# Patient Record
Sex: Male | Born: 1960 | Race: White | Hispanic: No | Marital: Married | Smoking: Current every day smoker
Health system: Southern US, Community
[De-identification: ages and names within clinical notes are randomized; demographics above are authoritative.]

## PROBLEM LIST (undated history)

## (undated) DIAGNOSIS — B029 Zoster without complications: Secondary | ICD-10-CM

## (undated) DIAGNOSIS — I10 Essential (primary) hypertension: Secondary | ICD-10-CM

## (undated) DIAGNOSIS — B019 Varicella without complication: Secondary | ICD-10-CM

## (undated) DIAGNOSIS — L0390 Cellulitis, unspecified: Secondary | ICD-10-CM

## (undated) HISTORY — PX: UVULOPALATOPHARYNGOPLASTY: SHX827

---

## 2012-12-26 ENCOUNTER — Other Ambulatory Visit: Payer: Self-pay | Admitting: Orthopedic Surgery

## 2012-12-26 DIAGNOSIS — M25511 Pain in right shoulder: Secondary | ICD-10-CM

## 2013-01-11 ENCOUNTER — Ambulatory Visit
Admission: RE | Admit: 2013-01-11 | Discharge: 2013-01-11 | Disposition: A | Discharge status: Home / Self Care | Payer: BC Managed Care – PPO | Source: Ambulatory Visit | Attending: Orthopedic Surgery | Admitting: Orthopedic Surgery

## 2013-01-11 DIAGNOSIS — M25511 Pain in right shoulder: Secondary | ICD-10-CM

## 2013-01-11 MED ORDER — IOHEXOL 180 MG/ML  SOLN: 15.0000 mL | Freq: Once | INTRAMUSCULAR | Status: AC | PRN

## 2013-01-15 ENCOUNTER — Other Ambulatory Visit: Payer: Self-pay | Admitting: Orthopedic Surgery

## 2013-01-15 DIAGNOSIS — M25512 Pain in left shoulder: Secondary | ICD-10-CM

## 2013-01-20 ENCOUNTER — Ambulatory Visit
Admission: RE | Admit: 2013-01-20 | Discharge: 2013-01-20 | Disposition: A | Discharge status: Home / Self Care | Payer: BC Managed Care – PPO | Source: Ambulatory Visit | Attending: Orthopedic Surgery | Admitting: Orthopedic Surgery

## 2013-01-20 DIAGNOSIS — M25512 Pain in left shoulder: Secondary | ICD-10-CM

## 2013-08-08 ENCOUNTER — Emergency Department (HOSPITAL_COMMUNITY)
Admission: EM | Admit: 2013-08-08 | Discharge: 2013-08-08 | Disposition: A | Discharge status: Home / Self Care | Payer: BC Managed Care – PPO | Attending: Emergency Medicine | Admitting: Emergency Medicine

## 2013-08-08 ENCOUNTER — Encounter (HOSPITAL_COMMUNITY): Payer: Self-pay | Admitting: Emergency Medicine

## 2013-08-08 ENCOUNTER — Emergency Department (HOSPITAL_COMMUNITY): Payer: BC Managed Care – PPO

## 2013-08-08 DIAGNOSIS — Y9389 Activity, other specified: Secondary | ICD-10-CM | POA: Insufficient documentation

## 2013-08-08 DIAGNOSIS — Z880 Allergy status to penicillin: Secondary | ICD-10-CM | POA: Insufficient documentation

## 2013-08-08 DIAGNOSIS — Y929 Unspecified place or not applicable: Secondary | ICD-10-CM | POA: Insufficient documentation

## 2013-08-08 DIAGNOSIS — S299XXA Unspecified injury of thorax, initial encounter: Secondary | ICD-10-CM

## 2013-08-08 DIAGNOSIS — I10 Essential (primary) hypertension: Secondary | ICD-10-CM | POA: Insufficient documentation

## 2013-08-08 DIAGNOSIS — F17200 Nicotine dependence, unspecified, uncomplicated: Secondary | ICD-10-CM | POA: Insufficient documentation

## 2013-08-08 DIAGNOSIS — S298XXA Other specified injuries of thorax, initial encounter: Secondary | ICD-10-CM | POA: Insufficient documentation

## 2013-08-08 DIAGNOSIS — W208XXA Other cause of strike by thrown, projected or falling object, initial encounter: Secondary | ICD-10-CM | POA: Insufficient documentation

## 2013-08-08 HISTORY — DX: Essential (primary) hypertension: I10

## 2013-08-08 MED ORDER — HYDROCODONE-ACETAMINOPHEN 5-325 MG PO TABS: 1.0000 | ORAL_TABLET | ORAL | Status: DC | PRN

## 2013-08-08 NOTE — Discharge Instructions (Signed)
Blunt Chest Trauma Call Dr. Duanne Guessewey to schedule an office visit if having significant pain by next week. Take Tylenol for mild pain or the pain medicine prescribed for bad pain Blunt chest trauma is an injury caused by a blow to the chest. These chest injuries can be very painful. Blunt chest trauma often results in bruised or broken (fractured) ribs. Most cases of bruised and fractured ribs from blunt chest traumas get better after 1 to 3 weeks of rest and pain medicine. Often, the soft tissue in the chest wall is also injured, causing pain and bruising. Internal organs, such as the heart and lungs, may also be injured. Blunt chest trauma can lead to serious medical problems. This injury requires immediate medical care. CAUSES   Motor vehicle collisions.  Falls.  Physical violence.  Sports injuries. SYMPTOMS   Chest pain. The pain may be worse when you move or breathe deeply.  Shortness of breath.  Lightheadedness.  Bruising.  Tenderness.  Swelling. DIAGNOSIS  Your caregiver will do a physical exam. X-rays may be taken to look for fractures. However, minor rib fractures may not show up on X-rays until a few days after the injury. If a more serious injury is suspected, further imaging tests may be done. This may include ultrasounds, computed tomography (CT) scans, or magnetic resonance imaging (MRI). TREATMENT  Treatment depends on the severity of your injury. Your caregiver may prescribe pain medicines and deep breathing exercises. HOME CARE INSTRUCTIONS  Limit your activities until you can move around without much pain.  Do not do any strenuous work until your injury is healed.  Put ice on the injured area.  Put ice in a plastic bag.  Place a towel between your skin and the bag.  Leave the ice on for 15-20 minutes, 03-04 times a day.  You may wear a rib belt as directed by your caregiver to reduce pain.  Practice deep breathing as directed by your caregiver to keep your  lungs clear.  Only take over-the-counter or prescription medicines for pain, fever, or discomfort as directed by your caregiver. SEEK IMMEDIATE MEDICAL CARE IF:   You have increasing pain or shortness of breath.  You cough up blood.  You have nausea, vomiting, or abdominal pain.  You have a fever.  You feel dizzy, weak, or you faint. MAKE SURE YOU:  Understand these instructions.  Will watch your condition.  Will get help right away if you are not doing well or get worse. Document Released: 01/29/2004 Document Revised: 03/15/2011 Document Reviewed: 10/07/2010 [facility] Patient Information 2015 [place], [place]. This information is not intended to replace advice given to you by your health care provider. Make sure you discuss any questions you have with your health care provider.

## 2013-08-08 NOTE — ED Notes (Signed)
Pt was bench pressing about 300 pounds 20 minutes ago when he dropped the bar from fully extended arms down onto his chest.  C/o central CP 6/10.  States he passed out "for a second" but was able to slide the bar off of himself.

## 2013-08-08 NOTE — ED Provider Notes (Signed)
CSN: [id number]     Arrival date & time 08/08/13  0932 History   First MD Initiated Contact with Patient 08/08/13 [phone]     Chief Complaint  Patient presents with  . Chest Injury     (Consider location/radiation/quality/duration/timing/severity/associated sxs/prior Treatment) HPI Complains of anterior bilateral chest pain onset 40 minutes ago after he was bench pressing 300 pounds and bar from bar bell dropped onto his anterior chest. Pain is worse with deep inspiration or with movement improved with remaining still. No other associated symptoms. No shortness of breath. No other injury. No treatment prior to coming here. Pain is moderate at present Past Medical History  Diagnosis Date  . Hypertension    Past Surgical History  Procedure Laterality Date  . Uvulopalatopharyngoplasty     History reviewed. No pertinent family history. History  Substance Use Topics  . Smoking status: Current Every Day Smoker  . Smokeless tobacco: Not on file  . Alcohol Use: No    no illicit drug use Review of Systems  Constitutional: Negative.   HENT: Negative.   Respiratory: Negative.   Cardiovascular: Positive for chest pain.  Gastrointestinal: Negative.   Musculoskeletal: Negative.   Skin: Negative.   Neurological: Negative.   Psychiatric/Behavioral: Negative.   All other systems reviewed and are negative.     Allergies  Amoxicillin  Home Medications   Prior to Admission medications   Not on File   BP 171/87  Pulse 86  Temp(Src) 98.6 F (37 C) (Oral)  Resp 16  SpO2 100% Physical Exam  Nursing note and vitals reviewed. Constitutional: He appears well-developed and well-nourished. No distress.  HENT:  Head: Normocephalic and atraumatic.  Eyes: Conjunctivae are normal. Pupils are equal, round, and reactive to light.  Neck: Neck supple. No tracheal deviation present. No thyromegaly present.  Cardiovascular: Normal rate and regular rhythm.   No murmur heard. Pulmonary/Chest:  Effort normal and breath sounds normal. No respiratory distress. He exhibits tenderness.  No ecchymosis . No deformity Tender over anterior chest wall, bilaterally over lower ribs and sternum, no crepitance no flail.  Abdominal: Soft. Bowel sounds are normal. He exhibits no distension. There is no tenderness.  Musculoskeletal: Normal range of motion. He exhibits no edema and no tenderness.  Neurological: He is alert. Coordination normal.  Skin: Skin is warm and dry. No rash noted.  Psychiatric: He has a normal mood and affect.    ED Course  Procedures (including critical care time) Labs Review Labs Reviewed - No data to display  Imaging Review No results found.   EKG Interpretation   Date/Time:  Wednesday August 08 2013 07:57:27 EDT Ventricular Rate:  93 PR Interval:  154 QRS Duration: 91 QT Interval:  326 QTC Calculation: 405 R Axis:   26 Text Interpretation:  Sinus rhythm Baseline wander in lead(s) V2 V3 V4 V5  No old tracing to compare Confirmed by Gared Gillie  MD, Hazell Siwik [phone]) on  08/08/2013 8:17:28 AM      No results found for this or any previous visit. Dg Chest 2 View  08/08/2013   CLINICAL Plan prescription NorcoDATA:  Chest injury involving anterior ribs.  EXAM: CHEST - 2 VIEW  COMPA diagnosisRISON:  None  FINDINGS: The heart size and mediastinal contours are within normal limits. There is no evidence of pulmonary edema, consolidation, pneumothorax, nodule or pleural fluid. The visualized skeletal structures are unremarkable.  IMPRESSION: Normal chest x-ray.   Electronically Signed   By: Irish Lack M.D.   On: 08/08/2013 08:29  Declines pain medicine Chest x-ray viewed by me MDM  plan rx norco Follow up with Dr,. Dewey if significant pain by next week Dx Blunt chest trauma Final diagnoses:  None        Doug SouSam Carmalita Wakefield, MD 08/08/13 [phone]

## 2013-08-20 ENCOUNTER — Ambulatory Visit
Admission: RE | Admit: 2013-08-20 | Discharge: 2013-08-20 | Disposition: A | Discharge status: Home / Self Care | Payer: BC Managed Care – PPO | Source: Ambulatory Visit | Attending: Family Medicine | Admitting: Family Medicine

## 2013-08-20 ENCOUNTER — Other Ambulatory Visit: Payer: Self-pay | Admitting: Family Medicine

## 2013-08-20 DIAGNOSIS — R52 Pain, unspecified: Secondary | ICD-10-CM

## 2013-08-20 DIAGNOSIS — R0602 Shortness of breath: Secondary | ICD-10-CM

## 2013-08-20 DIAGNOSIS — T1490XA Injury, unspecified, initial encounter: Secondary | ICD-10-CM

## 2014-06-19 ENCOUNTER — Encounter (HOSPITAL_COMMUNITY): Payer: Self-pay

## 2014-06-19 ENCOUNTER — Emergency Department (HOSPITAL_COMMUNITY)
Admission: EM | Admit: 2014-06-19 | Discharge: 2014-06-20 | Disposition: A | Discharge status: Home / Self Care | Payer: BLUE CROSS/BLUE SHIELD | Attending: Emergency Medicine | Admitting: Emergency Medicine

## 2014-06-19 DIAGNOSIS — Z6833 Body mass index (BMI) 33.0-33.9, adult: Secondary | ICD-10-CM | POA: Insufficient documentation

## 2014-06-19 DIAGNOSIS — I10 Essential (primary) hypertension: Secondary | ICD-10-CM | POA: Insufficient documentation

## 2014-06-19 DIAGNOSIS — Z79899 Other long term (current) drug therapy: Secondary | ICD-10-CM | POA: Diagnosis not present

## 2014-06-19 DIAGNOSIS — G4730 Sleep apnea, unspecified: Secondary | ICD-10-CM | POA: Diagnosis not present

## 2014-06-19 DIAGNOSIS — T185XXA Foreign body in anus and rectum, initial encounter: Secondary | ICD-10-CM | POA: Insufficient documentation

## 2014-06-19 DIAGNOSIS — X58XXXA Exposure to other specified factors, initial encounter: Secondary | ICD-10-CM | POA: Diagnosis not present

## 2014-06-19 DIAGNOSIS — F17200 Nicotine dependence, unspecified, uncomplicated: Secondary | ICD-10-CM | POA: Diagnosis not present

## 2014-06-19 DIAGNOSIS — Z79891 Long term (current) use of opiate analgesic: Secondary | ICD-10-CM | POA: Insufficient documentation

## 2014-06-19 DIAGNOSIS — J449 Chronic obstructive pulmonary disease, unspecified: Secondary | ICD-10-CM | POA: Diagnosis not present

## 2014-06-19 NOTE — ED Notes (Signed)
Patient reports a silicone sex toy is stuck in his rectum.  Denies moving parts/batteries.  Reports he has been trying to remove it for the past several hours.

## 2014-06-20 ENCOUNTER — Emergency Department (HOSPITAL_COMMUNITY): Payer: BLUE CROSS/BLUE SHIELD | Admitting: Anesthesiology

## 2014-06-20 ENCOUNTER — Encounter (HOSPITAL_COMMUNITY)
Admission: EM | Disposition: A | Discharge status: Home / Self Care | Payer: Self-pay | Source: Home / Self Care | Attending: Emergency Medicine

## 2014-06-20 ENCOUNTER — Emergency Department (HOSPITAL_COMMUNITY): Payer: BLUE CROSS/BLUE SHIELD

## 2014-06-20 ENCOUNTER — Encounter (HOSPITAL_COMMUNITY): Payer: Self-pay | Admitting: Anesthesiology

## 2014-06-20 HISTORY — PX: FOREIGN BODY REMOVAL RECTAL: SHX5275

## 2014-06-20 LAB — BASIC METABOLIC PANEL
Anion gap: 8 (ref 5–15)
BUN: 29 mg/dL — AB (ref 6–20)
CO2: 24 mmol/L (ref 22–32)
Calcium: 9 mg/dL (ref 8.9–10.3)
Chloride: 109 mmol/L (ref 101–111)
Creatinine, Ser: 1.28 mg/dL — ABNORMAL HIGH (ref 0.61–1.24)
GFR calc Af Amer: >60 mL/min (ref >60)
Glucose, Bld: 97 mg/dL (ref 65–99)
Potassium: 4.2 mmol/L (ref 3.5–5.1)
Sodium: 141 mmol/L (ref 135–145)

## 2014-06-20 LAB — CBC WITH DIFFERENTIAL/PLATELET
Basophils Absolute: 0.1 10*3/uL (ref 0.0–0.1)
Basophils Relative: 1 % (ref 0–1)
EOS ABS: 0.2 10*3/uL (ref 0.0–0.7)
EOS PCT: 2 % (ref 0–5)
HCT: 45.6 % (ref 39.0–52.0)
Hemoglobin: 15.7 g/dL (ref 13.0–17.0)
Lymphocytes Relative: 19 % (ref 12–46)
Lymphs Abs: 1.9 10*3/uL (ref 0.7–4.0)
MCH: 32.7 pg (ref 26.0–34.0)
MCHC: 34.4 g/dL (ref 30.0–36.0)
MCV: 95 fL (ref 78.0–100.0)
Monocytes Absolute: 0.7 10*3/uL (ref 0.1–1.0)
Monocytes Relative: 8 % (ref 3–12)
Neutro Abs: 6.8 10*3/uL (ref 1.7–7.7)
Neutrophils Relative %: 70 % (ref 43–77)
PLATELETS: 250 10*3/uL (ref 150–400)
RBC: 4.8 MIL/uL (ref 4.22–5.81)
RDW: 13.2 % (ref 11.5–15.5)
WBC: 9.6 10*3/uL (ref 4.0–10.5)

## 2014-06-20 SURGERY — REMOVAL, FOREIGN BODY, RECTUM
Anesthesia: General

## 2014-06-20 MED ORDER — MIDAZOLAM HCL 2 MG/2ML IJ SOLN
0.5000 mg | Freq: Once | INTRAMUSCULAR | Status: DC | PRN
Start: 2014-06-20 — End: 2014-06-20

## 2014-06-20 MED ORDER — EPHEDRINE SULFATE 50 MG/ML IJ SOLN
INTRAMUSCULAR | Status: AC
  Filled 2014-06-20: qty 1

## 2014-06-20 MED ORDER — FENTANYL CITRATE (PF) 100 MCG/2ML IJ SOLN
INTRAMUSCULAR | Status: DC | PRN
  Administered 2014-06-20: 50 ug via INTRAVENOUS

## 2014-06-20 MED ORDER — MIDAZOLAM HCL 2 MG/2ML IJ SOLN
INTRAMUSCULAR | Status: AC
  Filled 2014-06-20: qty 2

## 2014-06-20 MED ORDER — ONDANSETRON HCL 4 MG/2ML IJ SOLN
INTRAMUSCULAR | Status: AC
  Filled 2014-06-20: qty 2

## 2014-06-20 MED ORDER — LIDOCAINE HCL (CARDIAC) 20 MG/ML IV SOLN
INTRAVENOUS | Status: AC
  Filled 2014-06-20: qty 5

## 2014-06-20 MED ORDER — FENTANYL CITRATE (PF) 100 MCG/2ML IJ SOLN: 25.0000 ug | INTRAMUSCULAR | Status: DC | PRN

## 2014-06-20 MED ORDER — MIDAZOLAM HCL 5 MG/5ML IJ SOLN
INTRAMUSCULAR | Status: DC | PRN
  Administered 2014-06-20: 2 mg via INTRAVENOUS

## 2014-06-20 MED ORDER — PROMETHAZINE HCL 25 MG/ML IJ SOLN: 6.2500 mg | INTRAMUSCULAR | Status: DC | PRN

## 2014-06-20 MED ORDER — LACTATED RINGERS IV SOLN
INTRAVENOUS | Status: DC | PRN
  Administered 2014-06-20: 07:00:00 via INTRAVENOUS

## 2014-06-20 MED ORDER — ONDANSETRON HCL 4 MG/2ML IJ SOLN
INTRAMUSCULAR | Status: DC | PRN
  Administered 2014-06-20: 4 mg via INTRAVENOUS

## 2014-06-20 MED ORDER — MEPERIDINE HCL 50 MG/ML IJ SOLN: 6.2500 mg | INTRAMUSCULAR | Status: DC | PRN

## 2014-06-20 MED ORDER — FENTANYL CITRATE (PF) 100 MCG/2ML IJ SOLN
INTRAMUSCULAR | Status: AC
  Filled 2014-06-20: qty 2

## 2014-06-20 MED ORDER — PROPOFOL 10 MG/ML IV BOLUS
INTRAVENOUS | Status: DC | PRN
  Administered 2014-06-20: 200 mg via INTRAVENOUS

## 2014-06-20 MED ORDER — SODIUM CHLORIDE 0.9 % IJ SOLN
INTRAMUSCULAR | Status: AC
  Filled 2014-06-20: qty 10

## 2014-06-20 MED ORDER — LIDOCAINE HCL (CARDIAC) 20 MG/ML IV SOLN
INTRAVENOUS | Status: DC | PRN
  Administered 2014-06-20: 100 mg via INTRAVENOUS

## 2014-06-20 MED ORDER — PROPOFOL 10 MG/ML IV BOLUS
INTRAVENOUS | Status: AC
  Filled 2014-06-20: qty 20

## 2014-06-20 SURGICAL SUPPLY — 34 items
BLADE HEX COATED 2.75 (ELECTRODE) ×3 IMPLANT
BLADE SURG 15 STRL LF DISP TIS (BLADE) ×1 IMPLANT
BLADE SURG 15 STRL SS (BLADE) ×2
BLADE SURG SZ10 CARB STEEL (BLADE) ×3 IMPLANT
COVER MAYO STAND STRL (DRAPES) ×3 IMPLANT
DECANTER SPIKE VIAL GLASS SM (MISCELLANEOUS) ×3 IMPLANT
DRAPE SHEET LG 3/4 BI-LAMINATE (DRAPES) ×3 IMPLANT
DRSG PAD ABDOMINAL 8X10 ST (GAUZE/BANDAGES/DRESSINGS) ×3 IMPLANT
ELECT REM PT RETURN 9FT ADLT (ELECTROSURGICAL) ×3
ELECTRODE REM PT RTRN 9FT ADLT (ELECTROSURGICAL) ×1 IMPLANT
GAUZE SPONGE 4X4 12PLY STRL (GAUZE/BANDAGES/DRESSINGS) ×3 IMPLANT
GAUZE SPONGE 4X4 16PLY XRAY LF (GAUZE/BANDAGES/DRESSINGS) ×3 IMPLANT
GLOVE BIOGEL PI IND STRL 7.0 (GLOVE) ×1 IMPLANT
GLOVE BIOGEL PI INDICATOR 7.0 (GLOVE) ×2
GLOVE SURG SIGNA 7.5 PF LTX (GLOVE) ×3 IMPLANT
GOWN SPEC L4 XLG W/TWL (GOWN DISPOSABLE) ×3 IMPLANT
GOWN STRL REUS W/ TWL XL LVL3 (GOWN DISPOSABLE) ×3 IMPLANT
GOWN STRL REUS W/TWL LRG LVL3 (GOWN DISPOSABLE) ×3 IMPLANT
GOWN STRL REUS W/TWL XL LVL3 (GOWN DISPOSABLE) ×6
KIT BASIN OR (CUSTOM PROCEDURE TRAY) ×3 IMPLANT
NDL SAFETY ECLIPSE 18X1.5 (NEEDLE) IMPLANT
NEEDLE HYPO 18GX1.5 SHARP (NEEDLE)
NEEDLE HYPO 25X1 1.5 SAFETY (NEEDLE) IMPLANT
NS IRRIG 1000ML POUR BTL (IV SOLUTION) ×3 IMPLANT
PACK LITHOTOMY IV (CUSTOM PROCEDURE TRAY) ×3 IMPLANT
PENCIL BUTTON HOLSTER BLD 10FT (ELECTRODE) ×3 IMPLANT
SPONGE SURGIFOAM ABS GEL 12-7 (HEMOSTASIS) IMPLANT
SURGILUBE 3G PEEL PACK STRL (MISCELLANEOUS) ×18 IMPLANT
SUT CHROMIC 2 0 SH (SUTURE) IMPLANT
SUT CHROMIC 3 0 SH 27 (SUTURE) IMPLANT
SYR CONTROL 10ML LL (SYRINGE) ×3 IMPLANT
TOWEL OR 17X26 10 PK STRL BLUE (TOWEL DISPOSABLE) ×3 IMPLANT
WATER STERILE IRR 1500ML POUR (IV SOLUTION) ×3 IMPLANT
YANKAUER SUCT BULB TIP 10FT TU (MISCELLANEOUS) ×3 IMPLANT

## 2014-06-20 NOTE — Anesthesia Preprocedure Evaluation (Addendum)
Anesthesia Evaluation  Patient identified by MRN, date of birth, ID band Patient awake    Reviewed: Allergy & Precautions, NPO status , Patient's Chart, lab work & pertinent test results  History of Anesthesia Complications (+) history of anesthetic complications (hypertension)  Airway Mallampati: II  TM Distance: >3 FB Neck ROM: Full    Dental  (+) Dental Advisory Given   Pulmonary sleep apnea (treated with UPPP) , COPDCurrent Smoker,  S/p UPPP breath sounds clear to auscultation        Cardiovascular hypertension (no longer on meds), Rhythm:Regular Rate:Normal     Neuro/Psych negative neurological ROS     GI/Hepatic negative GI ROS, Neg liver ROS,   Endo/Other  Morbid obesity  Renal/GU negative Renal ROS     Musculoskeletal   Abdominal (+) + obese,   Peds  Hematology negative hematology ROS (+)   Anesthesia Other Findings   Reproductive/Obstetrics                            Anesthesia Physical Anesthesia Plan  ASA: III  Anesthesia Plan: General   Post-op Pain Management:    Induction: Intravenous  Airway Management Planned: LMA  Additional Equipment:   Intra-op Plan:   Post-operative Plan:   Informed Consent: I have reviewed the patients History and Physical, chart, labs and discussed the procedure including the risks, benefits and alternatives for the proposed anesthesia with the patient or authorized representative who has indicated his/her understanding and acceptance.   Dental advisory given  Plan Discussed with: CRNA and Surgeon  Anesthesia Plan Comments: (Plan routine monitors, GA- LMA OK)        Anesthesia Quick Evaluation

## 2014-06-20 NOTE — Transfer of Care (Signed)
Immediate Anesthesia Transfer of Care Note  Patient: Gary Davies  Procedure(s) Performed: Procedure(s): REMOVAL FOREIGN BODY RECTAL (N/A)  Patient Location: PACU  Anesthesia Type:General  Level of Consciousness: awake, alert  and oriented  Airway & Oxygen Therapy: Patient Spontanous Breathing and Patient connected to face mask oxygen  Post-op Assessment: Report given to RN and Post -op Vital signs reviewed and stable  Post vital signs: Reviewed and stable  Last Vitals:  Filed Vitals:   06/20/14 0537  BP: 147/93  Pulse: 88  Temp: 37.1 C  Resp: 16    Complications: No apparent anesthesia complications

## 2014-06-20 NOTE — ED Notes (Signed)
Surgery at bedside.

## 2014-06-20 NOTE — Op Note (Addendum)
06/19/2014 - 06/20/2014  8:23 AM  PATIENT:  Gary Davies, 54 y.o., male, MRN: [id number]  PREOP DIAGNOSIS:  Foreign body in rectum  POSTOP DIAGNOSIS:   Foreign body in rectum (photo at end of note)  PROCEDURE:   Procedure(s):  REMOVAL FOREIGN BODY RECTAL, Exam under anesthesia.  SURGEON:   Ovidio Kin, M.D.  ANESTHESIA:   general  Anesthesiologist: Jairo Ben, MD CRNA: Thornell Mule, CRNA  General  EBL:  none  SPECIMEN:   Foreign body removed.  Photo in Epic  COUNTS CORRECT:  YES  INDICATIONS FOR PROCEDURE:  [name] is a 54 y.o. (DOB: 08/12/1960) white  male whose primary care physician is No primary care provider on file. and comes for foreign body in rectum.   The patient requests that his wife not be told about the foreign body.   The indications and risks of the surgery were explained to the patient.  The risks include, but are not limited to, infection, bleeding, and nerve injury.  PROCEDURE:  The patient was placed in lithotomy.   A time out was held and the surgical checklist run.   I was able to palpate the foreign body through the abdominal wall.  I could feel the foreign body about 6 cm up the rectum.  I was able to push the foreign body down and retrieve it through the rectum.   There was no blood on the foreign body or in the rectum.   He will go home today with return to our office on a PRN basis.   Foreign body retrieved from rectum.    Ovidio Kin, MD, [facility] Surgery Pager: [phone] Office phone:  [phone]

## 2014-06-20 NOTE — ED Notes (Signed)
Patient transported to CT 

## 2014-06-20 NOTE — Addendum Note (Signed)
Addendum  created 06/20/14 0944 by Thornell Mule, CRNA   Modules edited: Anesthesia Attestations

## 2014-06-20 NOTE — ED Notes (Signed)
Pt reports getting restless, feeling "feverish", asking if he can go outside "on fresh air, because I'm going crazy here". Pt sts if we can't tell him when exactly is somebody come to see him, maybe he should just go elsewhere. Explained to pt that at this point he is waiting for surgeon to see him, and that if he goes to other area ED he will have to go through all process all over again. Pt voiced understanding.

## 2014-06-20 NOTE — Progress Notes (Signed)
No rectal drainage noted post op

## 2014-06-20 NOTE — Progress Notes (Signed)
54 yo male with foreign body up rectum.  Plan EUA with removal of foreign body.  No acute abdominal symptoms.  Wife at bedside.  He has not told her what he has done and he request that we do not tell her.  He understands that there is a chance of a laparotomy.  Ovidio Kin, MD, [facility] Surgery Pager: [phone] Office phone:  [phone]

## 2014-06-20 NOTE — ED Provider Notes (Signed)
CSN: [id number]     Arrival date & time 06/19/14  2334 History   First MD Initiated Contact with Patient 06/20/14 0205     Chief Complaint  Patient presents with  . Foreign Body in Rectum     (Consider location/radiation/quality/duration/timing/severity/associated sxs/prior Treatment) Patient is a 54 y.o. male presenting with foreign body in rectum. The history is provided by the patient. No language interpreter was used.  Foreign Body in Rectum This is a new problem. The current episode started today. Pertinent negatives include no chills or fever. Associated symptoms comments: He placed a non-powered sexual toy (silicone) into the rectum yesterday and has been unable to remove it despite many hours of attempt. No nausea or vomiting. No blood per rectum. He denies significant pain..    Past Medical History  Diagnosis Date  . Hypertension    Past Surgical History  Procedure Laterality Date  . Uvulopalatopharyngoplasty     No family history on file. History  Substance Use Topics  . Smoking status: Current Every Day Smoker  . Smokeless tobacco: Not on file  . Alcohol Use: No    Review of Systems  Constitutional: Negative for fever and chills.  HENT: Negative.   Respiratory: Negative.   Cardiovascular: Negative.   Gastrointestinal: Negative.        See HPI.  Musculoskeletal: Negative.   Skin: Negative.   Neurological: Negative.       Allergies  Amoxicillin  Home Medications   Prior to Admission medications   Medication Sig Start Date End Date Taking? Authorizing Provider  HYDROcodone-acetaminophen (NORCO) 5-325 MG per tablet Take 1-2 tablets by mouth every 4 (four) hours as needed for severe pain. 08/08/13   Doug Sou, MD  nebivolol (BYSTOLIC) 10 MG tablet Take 10 mg by mouth every morning.    Historical Provider, MD  Omega-3 Fatty Acids (FISH OIL) 1000 MG CAPS Take 4,000 mg by mouth every evening.    Historical Provider, MD  OVER THE COUNTER MEDICATION Take 1  packet by mouth at bedtime. Magnesium Powder    Historical Provider, MD  Testosterone (AXIRON) 30 MG/ACT SOLN Place 4 application onto the skin every morning. Applies to each armpit.    Historical Provider, MD  valsartan (DIOVAN) 320 MG tablet Take 320 mg by mouth every morning.    Historical Provider, MD   BP 158/97 mmHg  Pulse 102  Temp(Src) 98.9 F (37.2 C) (Oral)  Resp 18  Ht  (1.676 m)  Wt 220 lb (99.791 kg)  BMI 35.53 kg/m2  SpO2 100% Physical Exam  Constitutional: He is oriented to person, place, and time. He appears well-developed and well-nourished.  Neck: Normal range of motion.  Pulmonary/Chest: Effort normal.  Abdominal: Soft. He exhibits no distension. There is no tenderness.  Genitourinary:  No swelling, bleeding or palpable foreign body on rectal exam.   Musculoskeletal: Normal range of motion.  Neurological: He is alert and oriented to person, place, and time.  Skin: Skin is warm and dry.  Psychiatric: He has a normal mood and affect.    ED Course  Procedures (including critical care time) Labs Review Labs Reviewed  CBC WITH DIFFERENTIAL/PLATELET  BASIC METABOLIC PANEL   Results for orders placed or performed during the hospital encounter of 06/19/14  CBC with Differential  Result Value Ref Range   WBC 9.6 4.0 - 10.5 K/uL   RBC 4.80 4.22 - 5.81 MIL/uL   Hemoglobin 15.7 13.0 - 17.0 g/dL   HCT 40.9 81.1 - 91.4 %  MCV 95.0 78.0 - 100.0 fL   MCH 32.7 26.0 - 34.0 pg   MCHC 34.4 30.0 - 36.0 g/dL   RDW 48.1 85.6 - 31.4 %   Platelets 250 150 - 400 K/uL   Neutrophils Relative % 70 43 - 77 %   Neutro Abs 6.8 1.7 - 7.7 K/uL   Lymphocytes Relative 19 12 - 46 %   Lymphs Abs 1.9 0.7 - 4.0 K/uL   Monocytes Relative 8 3 - 12 %   Monocytes Absolute 0.7 0.1 - 1.0 K/uL   Eosinophils Relative 2 0 - 5 %   Eosinophils Absolute 0.2 0.0 - 0.7 K/uL   Basophils Relative 1 0 - 1 %   Basophils Absolute 0.1 0.0 - 0.1 K/uL  Basic metabolic panel  Result Value Ref Range    Sodium 141 135 - 145 mmol/L   Potassium 4.2 3.5 - 5.1 mmol/L   Chloride 109 101 - 111 mmol/L   CO2 24 22 - 32 mmol/L   Glucose, Bld 97 65 - 99 mg/dL   BUN 29 (H) 6 - 20 mg/dL   Creatinine, Ser 9.70 (H) 0.61 - 1.24 mg/dL   Calcium 9.0 8.9 - 26.3 mg/dL   GFR calc non Af Amer >60 >60 mL/min   GFR calc Af Amer >60 >60 mL/min   Anion gap 8 5 - 15    Imaging Review Dg Abd Acute W/chest  06/20/2014   CLINICAL DATA:  Silicone foreign body stuck in the rectum. Unable to remove over the past several hours.  EXAM: DG ABDOMEN ACUTE W/ 1V CHEST  COMPARISON:  Chest 08/20/2013  FINDINGS: Normal heart size and pulmonary vascularity. No focal airspace disease or consolidation in the lungs. No blunting of costophrenic angles. No pneumothorax. Mediastinal contours appear intact.  Gas and stool throughout the colon. No small or large bowel distention. No free intra-abdominal air. No abnormal air-fluid levels. No radiopaque stones identified. No radiopaque foreign bodies visualized.  IMPRESSION: No evidence of active pulmonary disease. Normal nonobstructive bowel gas pattern. Rectal foreign body is not identified.   Electronically Signed   By: Burman Nieves M.D.   On: 06/20/2014 02:20     EKG Interpretation None      MDM   Final diagnoses:  None    1. Rectal foreign body  The patient is in NAD. Comfortable, resting. Lab studies normal  Discussed with Dr. Carolynne Edouard who will see the patient in the ED for further management.     Elpidio Anis, PA-C 06/20/14 [id number]  Tomasita Crumble, MD 06/20/14 [phone]

## 2014-06-20 NOTE — Anesthesia Postprocedure Evaluation (Signed)
  Anesthesia Post-op Note  Patient: Gary Davies  Procedure(s) Performed: Procedure(s): REMOVAL FOREIGN BODY RECTAL (N/A)  Patient Location: PACU  Anesthesia Type:General  Level of Consciousness: awake, alert , oriented and patient cooperative  Airway and Oxygen Therapy: Patient Spontanous Breathing  Post-op Pain: none  Post-op Assessment: Post-op Vital signs reviewed, Patient's Cardiovascular Status Stable, Respiratory Function Stable, Patent Airway, No signs of Nausea or vomiting and Pain level controlled              Post-op Vital Signs: Reviewed and stable  Last Vitals:  Filed Vitals:   06/20/14 0915  BP: 164/88  Pulse: 83  Temp:   Resp: 17    Complications: No apparent anesthesia complications

## 2014-06-20 NOTE — H&P (Signed)
Gary Davies is an 54 y.o. male.   Chief Complaint: Dildo in rectum HPI:  The patient is a 54 year old white male who put a dildo up his rectum last night about 5 PM. He tried to get it out but could not. He came to the emergency department during the night. A CT scan confirmed the dildo in his rectosigmoid. There is no sign of perforation. He denies abdominal pain.   Past Medical History  Diagnosis Date  . Hypertension     Past Surgical History  Procedure Laterality Date  . Uvulopalatopharyngoplasty      No family history on file. Social History:  reports that he has been smoking.  He does not have any smokeless tobacco history on file. He reports that he does not drink alcohol or use illicit drugs.  Allergies:  Allergies  Allergen Reactions  . Amoxicillin     Generalized swelling     (Not in a hospital admission)  Results for orders placed or performed during the hospital encounter of 06/19/14 (from the past 48 hour(s))  CBC with Differential     Status: None   Collection Time: 06/20/14  2:23 AM  Result Value Ref Range   WBC 9.6 4.0 - 10.5 K/uL   RBC 4.80 4.22 - 5.81 MIL/uL   Hemoglobin 15.7 13.0 - 17.0 g/dL   HCT 45.6 39.0 - 52.0 %   MCV 95.0 78.0 - 100.0 fL   MCH 32.7 26.0 - 34.0 pg   MCHC 34.4 30.0 - 36.0 g/dL   RDW 13.2 11.5 - 15.5 %   Platelets 250 150 - 400 K/uL   Neutrophils Relative % 70 43 - 77 %   Neutro Abs 6.8 1.7 - 7.7 K/uL   Lymphocytes Relative 19 12 - 46 %   Lymphs Abs 1.9 0.7 - 4.0 K/uL   Monocytes Relative 8 3 - 12 %   Monocytes Absolute 0.7 0.1 - 1.0 K/uL   Eosinophils Relative 2 0 - 5 %   Eosinophils Absolute 0.2 0.0 - 0.7 K/uL   Basophils Relative 1 0 - 1 %   Basophils Absolute 0.1 0.0 - 0.1 K/uL  Basic metabolic panel     Status: Abnormal   Collection Time: 06/20/14  2:23 AM  Result Value Ref Range   Sodium 141 135 - 145 mmol/L   Potassium 4.2 3.5 - 5.1 mmol/L   Chloride 109 101 - 111 mmol/L   CO2 24 22 - 32 mmol/L   Glucose, Bld 97  65 - 99 mg/dL   BUN 29 (H) 6 - 20 mg/dL   Creatinine, Ser 1.28 (H) 0.61 - 1.24 mg/dL   Calcium 9.0 8.9 - 10.3 mg/dL   GFR calc non Af Amer >60 >60 mL/min   GFR calc Af Amer >60 >60 mL/min    Comment: (NOTE) The eGFR has been calculated using the CKD EPI equation. This calculation has not been validated in all clinical situations. eGFR's persistently <60 mL/min signify possible Chronic Kidney Disease.    Anion gap 8 5 - 15   Ct Abdomen Pelvis Wo Contrast  06/20/2014   CLINICAL DATA:  Foreign body in the rectum.  EXAM: CT ABDOMEN AND PELVIS WITHOUT CONTRAST  TECHNIQUE: Multidetector CT imaging of the abdomen and pelvis was performed following the standard protocol without IV contrast.  COMPARISON:  Abdomen series 06/20/2014  FINDINGS: Mild dependent changes in the lung bases.  Unenhanced appearance of the liver, spleen, gallbladder, pancreas, adrenal glands, kidneys, inferior vena cava, and retroperitoneal  lymph nodes is unremarkable. Mild calcification of the abdominal aorta without aneurysm. Stomach and small bowel are decompressed. Diffusely stool-filled colon without distention. No free air or free fluid in the abdomen. Abdominal wall musculature appears intact.  Pelvis: There is an elongated mildly radiodense foreign body demonstrated in the rectum and extending towards the sigmoid colon. The foreign body measures 18.9 cm in length by 4 cm in diameter. Foreign body in dense the dome of the bladder. Distal portion of foreign body arises about 6.5 cm above the anal verge. Gas in the bladder may result from instrumentation, fistula, or infection. No bladder wall thickening. Appendix is not identified. No free or loculated pelvic fluid collections. No pelvic mass or lymphadenopathy. Degenerative changes in the spine. Mild endplate compression at L1 appears chronic.  IMPRESSION: Foreign body in the rectosigmoid colon as described. Gas in the bladder may result from instrumentation, infection, or  fistula.   Electronically Signed   By: Lucienne Capers M.D.   On: 06/20/2014 03:09   Dg Abd Acute W/chest  06/20/2014   CLINICAL DATA:  Silicone foreign body stuck in the rectum. Unable to remove over the past several hours.  EXAM: DG ABDOMEN ACUTE W/ 1V CHEST  COMPARISON:  Chest 08/20/2013  FINDINGS: Normal heart size and pulmonary vascularity. No focal airspace disease or consolidation in the lungs. No blunting of costophrenic angles. No pneumothorax. Mediastinal contours appear intact.  Gas and stool throughout the colon. No small or large bowel distention. No free intra-abdominal air. No abnormal air-fluid levels. No radiopaque stones identified. No radiopaque foreign bodies visualized.  IMPRESSION: No evidence of active pulmonary disease. Normal nonobstructive bowel gas pattern. Rectal foreign body is not identified.   Electronically Signed   By: Lucienne Capers M.D.   On: 06/20/2014 02:20    Review of Systems  Constitutional: Negative.   HENT: Negative.   Eyes: Negative.   Respiratory: Negative.   Cardiovascular: Negative.   Gastrointestinal: Negative.   Genitourinary: Negative.   Musculoskeletal: Negative.   Skin: Negative.   Neurological: Negative.   Endo/Heme/Allergies: Negative.   Psychiatric/Behavioral: Negative.     Blood pressure 147/93, pulse 88, temperature 98.8 F (37.1 C), temperature source Oral, resp. rate 16, height 5' 6"  (1.676 m), weight 99.791 kg (220 lb), SpO2 97 %. Physical Exam  Constitutional: He is oriented to person, place, and time. He appears well-developed and well-nourished.  HENT:  Head: Normocephalic and atraumatic.  Eyes: Conjunctivae and EOM are normal. Pupils are equal, round, and reactive to light.  Neck: Normal range of motion. Neck supple.  Cardiovascular: Normal rate, regular rhythm and normal heart sounds.   Respiratory: Effort normal and breath sounds normal.  GI: Soft. Bowel sounds are normal.  Musculoskeletal: Normal range of motion.   Neurological: He is alert and oriented to person, place, and time.  Skin: Skin is warm and dry.  Psychiatric: He has a normal mood and affect. His behavior is normal.     Assessment/Plan  The patient has a dildo stuck in his rectosigmoid colon. On the scan it looks to be hooked on his coccyx. At this point I would recommend going to the operating room to try to get it out. Our first option would be to get it out through the rectum. If this cannot be done then he may require a laparoscopy or a laparotomy in order to get the foreign body free. I have discussed with him in detail the risks and benefits of this operation as well as  some of the technical aspects and he understands and wishes to proceed.  TOTH III,PAUL S 06/20/2014, 6:23 AM

## 2014-06-20 NOTE — ED Notes (Addendum)
Pt's valuables: cell phone, credit/debit cards, drivers license, work badge and $21.00 locked in safe at security office. Pt's clothing: shoes, jeans, shirt, underwear, socks and wrist watch placed in pt's patient's belongings bag and sent to OR with pt.

## 2014-06-20 NOTE — Anesthesia Procedure Notes (Signed)
Procedure Name: LMA Insertion Date/Time: 06/20/2014 8:07 AM Performed by: Thornell Mule Pre-anesthesia Checklist: Patient identified, Emergency Drugs available, Suction available, Patient being monitored and Timeout performed Patient Re-evaluated:Patient Re-evaluated prior to inductionOxygen Delivery Method: Circle system utilized Preoxygenation: Pre-oxygenation with 100% oxygen Intubation Type: IV induction LMA: LMA inserted LMA Size: 4.0 Number of attempts: 1 Placement Confirmation: positive ETCO2 and breath sounds checked- equal and bilateral Tube secured with: Tape

## 2014-06-20 NOTE — Discharge Instructions (Signed)
[facility] - DISCHARGE INSTRUCTIONS TO PATIENT  Return to work on:  tomorrow  Activity:  Driving - may drive tomorrow  Diet:  As tolerated  Follow up appointment:  No reason for follow up appointment.  Call Dr. Allene Pyo office [facility]) at [phone] for questions.  Medications and dosages:  Resume your home medications.   Call Dr. Ezzard Standing or his office  [phone]) if you have:  Persistent nausea and vomiting,  Severe uncontrolled pain,  Difficulty breathing, headache or visual disturbances,  Any other questions or concerns you may have after discharge.  In an emergency, call 911 or go to an Emergency Department at a nearby hospital.

## 2014-06-21 ENCOUNTER — Encounter (HOSPITAL_COMMUNITY): Payer: Self-pay | Admitting: Surgery

## 2015-03-23 ENCOUNTER — Emergency Department (HOSPITAL_COMMUNITY): Payer: BLUE CROSS/BLUE SHIELD

## 2015-03-23 ENCOUNTER — Encounter (HOSPITAL_COMMUNITY): Payer: Self-pay | Admitting: Emergency Medicine

## 2015-03-23 ENCOUNTER — Inpatient Hospital Stay (HOSPITAL_COMMUNITY)
Admission: EM | Admit: 2015-03-23 | Discharge: 2015-03-26 | DRG: 872 | Disposition: A | Discharge status: Home / Self Care | Payer: BLUE CROSS/BLUE SHIELD | Attending: Student in an Organized Health Care Education/Training Program | Admitting: Student in an Organized Health Care Education/Training Program

## 2015-03-23 DIAGNOSIS — A4151 Sepsis due to Escherichia coli [E. coli]: Secondary | ICD-10-CM | POA: Diagnosis not present

## 2015-03-23 DIAGNOSIS — Z880 Allergy status to penicillin: Secondary | ICD-10-CM

## 2015-03-23 DIAGNOSIS — I10 Essential (primary) hypertension: Secondary | ICD-10-CM | POA: Diagnosis present

## 2015-03-23 DIAGNOSIS — E872 Acidosis: Secondary | ICD-10-CM | POA: Diagnosis present

## 2015-03-23 DIAGNOSIS — R310 Gross hematuria: Secondary | ICD-10-CM | POA: Diagnosis present

## 2015-03-23 DIAGNOSIS — Z7982 Long term (current) use of aspirin: Secondary | ICD-10-CM

## 2015-03-23 DIAGNOSIS — N3000 Acute cystitis without hematuria: Secondary | ICD-10-CM

## 2015-03-23 DIAGNOSIS — F17210 Nicotine dependence, cigarettes, uncomplicated: Secondary | ICD-10-CM | POA: Diagnosis present

## 2015-03-23 DIAGNOSIS — Z881 Allergy status to other antibiotic agents status: Secondary | ICD-10-CM

## 2015-03-23 DIAGNOSIS — Z79899 Other long term (current) drug therapy: Secondary | ICD-10-CM

## 2015-03-23 DIAGNOSIS — N179 Acute kidney failure, unspecified: Secondary | ICD-10-CM | POA: Diagnosis present

## 2015-03-23 DIAGNOSIS — N390 Urinary tract infection, site not specified: Secondary | ICD-10-CM | POA: Diagnosis present

## 2015-03-23 DIAGNOSIS — Z791 Long term (current) use of non-steroidal anti-inflammatories (NSAID): Secondary | ICD-10-CM

## 2015-03-23 DIAGNOSIS — R7881 Bacteremia: Secondary | ICD-10-CM

## 2015-03-23 DIAGNOSIS — E860 Dehydration: Secondary | ICD-10-CM | POA: Diagnosis present

## 2015-03-23 DIAGNOSIS — Z8619 Personal history of other infectious and parasitic diseases: Secondary | ICD-10-CM

## 2015-03-23 HISTORY — DX: Varicella without complication: B01.9

## 2015-03-23 HISTORY — DX: Zoster without complications: B02.9

## 2015-03-23 HISTORY — DX: Cellulitis, unspecified: L03.90

## 2015-03-23 LAB — URINE MICROSCOPIC-ADD ON

## 2015-03-23 LAB — CBC WITH DIFFERENTIAL/PLATELET
BASOS ABS: 0 10*3/uL (ref 0.0–0.1)
BASOS PCT: 0 %
EOS ABS: 0.2 10*3/uL (ref 0.0–0.7)
EOS PCT: 1 %
HCT: 45.7 % (ref 39.0–52.0)
Hemoglobin: 15 g/dL (ref 13.0–17.0)
LYMPHS ABS: 0.5 10*3/uL — AB (ref 0.7–4.0)
Lymphocytes Relative: 3 %
MCH: 31.8 pg (ref 26.0–34.0)
MCHC: 32.8 g/dL (ref 30.0–36.0)
MCV: 96.8 fL (ref 78.0–100.0)
Monocytes Absolute: 0.2 10*3/uL (ref 0.1–1.0)
Monocytes Relative: 1 %
Neutro Abs: 17.8 10*3/uL — ABNORMAL HIGH (ref 1.7–7.7)
Neutrophils Relative %: 95 %
PLATELETS: 220 10*3/uL (ref 150–400)
RBC: 4.72 MIL/uL (ref 4.22–5.81)
RDW: 15.3 % (ref 11.5–15.5)
WBC: 18.6 10*3/uL — AB (ref 4.0–10.5)

## 2015-03-23 LAB — COMPREHENSIVE METABOLIC PANEL
ALK PHOS: 113 U/L (ref 38–126)
ALT: 52 U/L (ref 17–63)
AST: 43 U/L — ABNORMAL HIGH (ref 15–41)
Albumin: 3.2 g/dL — ABNORMAL LOW (ref 3.5–5.0)
Anion gap: 11 (ref 5–15)
BILIRUBIN TOTAL: 0.6 mg/dL (ref 0.3–1.2)
BUN: 19 mg/dL (ref 6–20)
CALCIUM: 9.2 mg/dL (ref 8.9–10.3)
CO2: 24 mmol/L (ref 22–32)
CREATININE: 2.02 mg/dL — AB (ref 0.61–1.24)
Chloride: 104 mmol/L (ref 101–111)
GFR calc Af Amer: 42 mL/min — ABNORMAL LOW (ref >60)
GFR, EST NON AFRICAN AMERICAN: 36 mL/min — AB (ref >60)
Glucose, Bld: 97 mg/dL (ref 65–99)
Potassium: 4.4 mmol/L (ref 3.5–5.1)
Sodium: 139 mmol/L (ref 135–145)
TOTAL PROTEIN: 6.2 g/dL — AB (ref 6.5–8.1)

## 2015-03-23 LAB — RAPID HIV SCREEN (HIV 1/2 AB+AG)
HIV 1/2 Antibodies: NONREACTIVE
HIV-1 P24 Antigen - HIV24: NONREACTIVE

## 2015-03-23 LAB — I-STAT CG4 LACTIC ACID, ED
LACTIC ACID, VENOUS: 1.92 mmol/L (ref 0.5–2.0)
Lactic Acid, Venous: 3.25 mmol/L (ref 0.5–2.0)

## 2015-03-23 LAB — URINALYSIS, ROUTINE W REFLEX MICROSCOPIC
Bilirubin Urine: NEGATIVE
Glucose, UA: NEGATIVE mg/dL
HGB URINE DIPSTICK: NEGATIVE
Ketones, ur: NEGATIVE mg/dL
NITRITE: POSITIVE — AB
PROTEIN: NEGATIVE mg/dL
Specific Gravity, Urine: 1.021 (ref 1.005–1.030)
pH: 5 (ref 5.0–8.0)

## 2015-03-23 MED ORDER — SODIUM CHLORIDE 0.9 % IV BOLUS (SEPSIS)
1000.0000 mL | Freq: Once | INTRAVENOUS | Status: AC
  Administered 2015-03-23: 1000 mL via INTRAVENOUS

## 2015-03-23 MED ORDER — CIPROFLOXACIN IN D5W 400 MG/200ML IV SOLN
400.0000 mg | Freq: Once | INTRAVENOUS | Status: AC
  Administered 2015-03-24: 400 mg via INTRAVENOUS
  Filled 2015-03-23: qty 200

## 2015-03-23 NOTE — ED Notes (Signed)
Family reports that pt has been sick for 6 weeks starting with chicken pox, then getting shingles, then having an allergic reaction to medications to having a fever and becoming delirious for 2 weeks. Pt diaphoretic in triage with labored breathing.

## 2015-03-23 NOTE — ED Provider Notes (Signed)
CSN: [id number]     Arrival date & time 03/23/15  1706 History   First MD Initiated Contact with Patient 03/23/15 1729     Chief Complaint  Patient presents with  . Fever  . Altered Mental Status     (Consider location/radiation/quality/duration/timing/severity/associated sxs/prior Treatment) HPI Patient presents to the emergency department with fever, cough and general weakness.  The patient states that he is also been confused the last 24 hours.  His wife also states that he has been confused.  The patient states that over the past 6 weeks she has had multiple infections and feels that his immune system is depleted. The patient denies chest pain, shortness of breath, headache,blurred vision, neck pain, weakness, numbness, dizziness, anorexia, edema, abdominal pain, nausea, vomiting, diarrhea, rash, back pain, dysuria, hematemesis, bloody stool, near syncope, or syncope.  Patient states he did not take any medications prior to arrival.  Patient states that he has had these fevers intermittently for a couple weeks, but got worse over the last 24 hours Past Medical History  Diagnosis Date  . Hypertension   . Chicken pox   . Shingles   . Cellulitis    Past Surgical History  Procedure Laterality Date  . Uvulopalatopharyngoplasty    . Foreign body removal rectal N/A 06/20/2014    Procedure: REMOVAL FOREIGN BODY RECTAL;  Surgeon: Ovidio Kin, MD;  Location: [facility];  Service: General;  Laterality: N/A;   No family history on file. Social History  Substance Use Topics  . Smoking status: Current Every Day Smoker  . Smokeless tobacco: None  . Alcohol Use: No    Review of Systems All other systems negative except as documented in the HPI. All pertinent positives and negatives as reviewed in the HPI.   Allergies  Amoxicillin and Keflex  Home Medications   Prior to Admission medications   Medication Sig Start Date End Date Taking? Authorizing Provider  Ascorbic Acid (VITAMIN C) 1000  MG tablet Take 1,000 mg by mouth daily as needed (for immune).   Yes Historical Provider, MD  aspirin 325 MG EC tablet Take 975 mg by mouth every 6 (six) hours as needed for pain or fever.   Yes Historical Provider, MD  ibuprofen (ADVIL,MOTRIN) 200 MG tablet Take 600 mg by mouth every 6 (six) hours as needed for fever or moderate pain.   Yes Historical Provider, MD  magnesium oxide (MAG-OX) 400 MG tablet Take 400 mg by mouth daily.   Yes Historical Provider, MD  Omega-3 Fatty Acids (FISH OIL) 1000 MG CAPS Take 3,000 mg by mouth every evening.    Yes Historical Provider, MD  Testosterone (AXIRON) 30 MG/ACT SOLN Place 2 application onto the skin 2 (two) times daily. Applies to each armpit.   Yes Historical Provider, MD  HYDROcodone-acetaminophen (NORCO) 5-325 MG per tablet Take 1-2 tablets by mouth every 4 (four) hours as needed for severe pain. Patient not taking: Reported on 06/20/2014 08/08/13   Doug Sou, MD   BP 109/66 mmHg  Pulse 72  Temp(Src) 100.2 F (37.9 C)  Resp 19  Ht  (1.753 m)  Wt 104.894 kg  BMI 34.13 kg/m2  SpO2 96% Physical Exam  Constitutional: He is oriented to person, place, and time. He appears well-developed and well-nourished. No distress.  HENT:  Head: Normocephalic and atraumatic.  Mouth/Throat: Oropharynx is clear and moist.  Eyes: Pupils are equal, round, and reactive to light.  Neck: Normal range of motion. Neck supple.  Cardiovascular: Normal rate, regular  rhythm and normal heart sounds.  Exam reveals no gallop and no friction rub.   No murmur heard. Pulmonary/Chest: Effort normal and breath sounds normal. No respiratory distress. He has no wheezes.  Abdominal: Soft. Bowel sounds are normal. He exhibits no distension. There is no tenderness.  Neurological: He is alert and oriented to person, place, and time. He exhibits normal muscle tone. Coordination normal.  Skin: Skin is warm and dry. No rash noted. No erythema.  Psychiatric: He has a normal mood  and affect. His behavior is normal.  Nursing note and vitals reviewed.   ED Course  Procedures (including critical care time) Labs Review Labs Reviewed  COMPREHENSIVE METABOLIC PANEL - Abnormal; Notable for the following:    Creatinine, Ser 2.02 (*)    Total Protein 6.2 (*)    Albumin 3.2 (*)    AST 43 (*)    GFR calc non Af Amer 36 (*)    GFR calc Af Amer 42 (*)    All other components within normal limits  CBC WITH DIFFERENTIAL/PLATELET - Abnormal; Notable for the following:    WBC 18.6 (*)    Neutro Abs 17.8 (*)    Lymphs Abs 0.5 (*)    All other components within normal limits  URINALYSIS, ROUTINE W REFLEX MICROSCOPIC (NOT AT [facility]) - Abnormal; Notable for the following:    Color, Urine AMBER (*)    APPearance CLOUDY (*)    Nitrite POSITIVE (*)    Leukocytes, UA SMALL (*)    All other components within normal limits  URINE MICROSCOPIC-ADD ON - Abnormal; Notable for the following:    Squamous Epithelial / LPF 0-5 (*)    Bacteria, UA FEW (*)    All other components within normal limits  CULTURE, BLOOD (ROUTINE X 2)  CULTURE, BLOOD (ROUTINE X 2)  URINE CULTURE  RAPID HIV SCREEN (HIV 1/2 AB+AG)  I-STAT CG4 LACTIC ACID, ED  I-STAT CG4 LACTIC ACID, ED    Imaging Review Dg Chest 2 View  03/23/2015  CLINICAL DATA:  Fevers, recent varicella infection EXAM: CHEST  2 VIEW COMPARISON:  06/10/14 FINDINGS: The heart size and mediastinal contours are within normal limits. Both lungs are clear. The visualized skeletal structures are unremarkable. IMPRESSION: No active cardiopulmonary disease. Electronically Signed   By: Alcide CleverMark  Lukens M.D.   On: 03/23/2015 18:37   I have personally reviewed and evaluated these images and lab results as part of my medical decision-making.    Patient be admitted to the hospital for dehydration and urinary tract infection.  The patient is advised the plan and all questions are answered  Charlestine NightChristopher Dwayna Kentner, PA-C 03/24/15 0036  Linwood DibblesJon Knapp, MD 03/24/15  [phone]

## 2015-03-24 ENCOUNTER — Encounter (HOSPITAL_COMMUNITY): Payer: Self-pay | Admitting: Internal Medicine

## 2015-03-24 DIAGNOSIS — B9620 Unspecified Escherichia coli [E. coli] as the cause of diseases classified elsewhere: Secondary | ICD-10-CM | POA: Diagnosis not present

## 2015-03-24 DIAGNOSIS — R310 Gross hematuria: Secondary | ICD-10-CM | POA: Diagnosis present

## 2015-03-24 DIAGNOSIS — E860 Dehydration: Secondary | ICD-10-CM | POA: Diagnosis present

## 2015-03-24 DIAGNOSIS — Z8619 Personal history of other infectious and parasitic diseases: Secondary | ICD-10-CM | POA: Diagnosis not present

## 2015-03-24 DIAGNOSIS — R509 Fever, unspecified: Secondary | ICD-10-CM

## 2015-03-24 DIAGNOSIS — I10 Essential (primary) hypertension: Secondary | ICD-10-CM

## 2015-03-24 DIAGNOSIS — N179 Acute kidney failure, unspecified: Secondary | ICD-10-CM | POA: Diagnosis present

## 2015-03-24 DIAGNOSIS — Z791 Long term (current) use of non-steroidal anti-inflammatories (NSAID): Secondary | ICD-10-CM | POA: Diagnosis not present

## 2015-03-24 DIAGNOSIS — Z880 Allergy status to penicillin: Secondary | ICD-10-CM | POA: Diagnosis not present

## 2015-03-24 DIAGNOSIS — A4151 Sepsis due to Escherichia coli [E. coli]: Secondary | ICD-10-CM | POA: Diagnosis present

## 2015-03-24 DIAGNOSIS — B9689 Other specified bacterial agents as the cause of diseases classified elsewhere: Secondary | ICD-10-CM

## 2015-03-24 DIAGNOSIS — E872 Acidosis: Secondary | ICD-10-CM | POA: Diagnosis present

## 2015-03-24 DIAGNOSIS — Z79899 Other long term (current) drug therapy: Secondary | ICD-10-CM | POA: Diagnosis not present

## 2015-03-24 DIAGNOSIS — Z7982 Long term (current) use of aspirin: Secondary | ICD-10-CM | POA: Diagnosis not present

## 2015-03-24 DIAGNOSIS — N390 Urinary tract infection, site not specified: Secondary | ICD-10-CM | POA: Diagnosis present

## 2015-03-24 DIAGNOSIS — Z881 Allergy status to other antibiotic agents status: Secondary | ICD-10-CM | POA: Diagnosis not present

## 2015-03-24 DIAGNOSIS — F17210 Nicotine dependence, cigarettes, uncomplicated: Secondary | ICD-10-CM | POA: Diagnosis present

## 2015-03-24 LAB — CBC
HEMATOCRIT: 38.6 % — AB (ref 39.0–52.0)
HEMOGLOBIN: 13.2 g/dL (ref 13.0–17.0)
MCH: 32.4 pg (ref 26.0–34.0)
MCHC: 34.2 g/dL (ref 30.0–36.0)
MCV: 94.8 fL (ref 78.0–100.0)
Platelets: 208 10*3/uL (ref 150–400)
RBC: 4.07 MIL/uL — AB (ref 4.22–5.81)
RDW: 15.6 % — ABNORMAL HIGH (ref 11.5–15.5)
WBC: 23.3 10*3/uL — AB (ref 4.0–10.5)

## 2015-03-24 LAB — BASIC METABOLIC PANEL
ANION GAP: 7 (ref 5–15)
BUN: 20 mg/dL (ref 6–20)
CALCIUM: 8.4 mg/dL — AB (ref 8.9–10.3)
CHLORIDE: 107 mmol/L (ref 101–111)
CO2: 24 mmol/L (ref 22–32)
Creatinine, Ser: 1.46 mg/dL — ABNORMAL HIGH (ref 0.61–1.24)
GFR calc Af Amer: >60 mL/min (ref >60)
GFR calc non Af Amer: 54 mL/min — ABNORMAL LOW (ref >60)
GLUCOSE: 104 mg/dL — AB (ref 65–99)
POTASSIUM: 4.5 mmol/L (ref 3.5–5.1)
Sodium: 138 mmol/L (ref 135–145)

## 2015-03-24 LAB — RPR: RPR: NONREACTIVE

## 2015-03-24 LAB — INFLUENZA PANEL BY PCR (TYPE A & B)
H1N1FLUPCR: NOT DETECTED
Influenza A By PCR: NEGATIVE
Influenza B By PCR: NEGATIVE

## 2015-03-24 LAB — LACTIC ACID, PLASMA: Lactic Acid, Venous: 0.8 mmol/L (ref 0.5–2.0)

## 2015-03-24 MED ORDER — HEPARIN SODIUM (PORCINE) 5000 UNIT/ML IJ SOLN
5000.0000 [IU] | Freq: Three times a day (TID) | INTRAMUSCULAR | Status: DC
  Filled 2015-03-24: qty 1

## 2015-03-24 MED ORDER — MELATONIN 3 MG PO TABS
1.5000 mg | ORAL_TABLET | Freq: Once | ORAL | Status: DC
  Filled 2015-03-24: qty 0.5

## 2015-03-24 MED ORDER — SODIUM CHLORIDE 0.9 % IV SOLN
INTRAVENOUS | Status: AC
Start: 2015-03-24 — End: 2015-03-24
  Administered 2015-03-24 (×2): via INTRAVENOUS

## 2015-03-24 MED ORDER — CIPROFLOXACIN IN D5W 400 MG/200ML IV SOLN
400.0000 mg | Freq: Two times a day (BID) | INTRAVENOUS | Status: DC
Start: 2015-03-24 — End: 2015-03-24
  Filled 2015-03-24 (×2): qty 200

## 2015-03-24 MED ORDER — NON FORMULARY: 1.5000 mg | Freq: Once | Status: DC

## 2015-03-24 MED ORDER — DEXTROSE 5 % IV SOLN
2.0000 g | INTRAVENOUS | Status: DC
  Administered 2015-03-24 – 2015-03-25 (×2): 2 g via INTRAVENOUS
  Filled 2015-03-24 (×2): qty 2

## 2015-03-24 NOTE — Progress Notes (Signed)
Utilization review completed. Tijah Hane, RN, BSN. 

## 2015-03-24 NOTE — Progress Notes (Signed)
CRITICAL VALUE ALERT  Critical value received:  Blood cultures positive for Gram negative Rods (both bottles)  Date of notification:  3/20  Time of notification:  907 am  Critical value read back:Yes.    Nurse who received alert:  Shelbie AmmonsKorie Jarissa Sheriff   MD notified (1st page):  Internal Medicine team-in person during rounds  Time of first page:  915 am  Time MD responded:  915 am

## 2015-03-24 NOTE — Progress Notes (Signed)
Pharmacy Antibiotic Note  Gary Davies is a 55 y.o. male admitted on 03/23/2015 with fever/AMS.  Pharmacy has been consulted for Cipro dosing for UTI  WBC elevated, pt in acute renal failure, lactic acid elevated  Plan: -Cipro 400 mg IV q12h -F/U infectious work-up -F/U urine culture for directed therapy  Height: 5\' 9"  (175.3 cm) Weight: 231 lb 4 oz (104.894 kg) IBW/kg (Calculated) : 70.7  Temp (24hrs), Avg:100.2 F (37.9 C), Min:100.2 F (37.9 C), Max:100.2 F (37.9 C)   Recent Labs Lab 03/23/15 1816 03/23/15 1817 03/23/15 2224  WBC 18.6*  --   --   CREATININE 2.02*  --   --   LATICACIDVEN  --  1.92 3.25*    Estimated Creatinine Clearance: 51.1 mL/min (by C-G formula based on Cr of 2.02).    Allergies  Allergen Reactions  . Amoxicillin Swelling and Other (See Comments)    Generalized swelling  . Keflex [Cephalexin] Other (See Comments)     Abran DukeLedford, Taura Lamarre 03/24/2015 1:46 AM

## 2015-03-24 NOTE — Progress Notes (Signed)
   Subjective: Patient continued to sweat overnight soaking his bedsheets. He says this is consistent with the episodic fevers and chills that had been ongoing a week prior to admission. Blood cultures returned positive 2/2 for GNRs this morning.  Objective: Vital signs in last 24 hours: Filed Vitals:   03/24/15 0200 03/24/15 0215 03/24/15 0230 03/24/15 0344  BP: 126/78 125/79 120/73 120/67  Pulse: 71 75 62 77  Temp:    98 F (36.7 C)  TempSrc:    Axillary  Resp: 14 21 17 18   Height:    5\' 9"  (1.753 m)  Weight:    104.781 kg (231 lb)  SpO2: 97% 94% 97% 97%   Weight change:   Intake/Output Summary (Last 24 hours) at 03/24/15 1357 Last data filed at 03/24/15 1100  Gross per 24 hour  Intake 1667.5 ml  Output    800 ml  Net  867.5 ml   GENERAL- muscular man, co-operative, NAD HEENT- Atraumatic, oral mucosa appears moist CARDIAC- RRR, no murmurs, rubs or gallops. RESP- CTAB, no wheezes or crackles. EXTREMITIES- pulse 2+, symmetric, no pedal edema. GENITOURINARY- Patchy vitiligo on dorsum of penis, urethra slightly displaced towards bottom SKIN- Warm, damp, no rashes or lesions. PSYCH- Normal mood and affect, appropriate thought content and speech.  Medications: I have reviewed the patient's current medications. Scheduled Meds: . cefTRIAXone (ROCEPHIN)  IV  2 g Intravenous Q24H  . heparin subcutaneous  5,000 Units Subcutaneous 3 times per day   Continuous Infusions:  PRN Meds:. Assessment/Plan: GNR Bacteremia 2/2 UTI: 2/2 blood cultures positive this morning. Fever of 100.19F overnight, Diaphoretic, leukocytosis to 23.3. Story and labs are consistent with UTI that has progressed now bacteremic within past 1-2wks. He is currently stable but may worsening before improvement on appropriate abtx in the next 1-2 days. He recently was on Keflex for scrotal cellulitis but no very likely causes of resistant organisms. Ceftriaxone empirically pending susceptibilities. -IV ceftriaxone 1g  daily -F/U blood and urine Cx -IVF replacement as needed for volume depletion -Will escalate with IV tobramycin double coverage if signs of becoming unstable  AKI: Cr 2.02->1.46. Prior admission 06/2014 Cr was 1.28. Likely dehydrated as he has extensive insensible losses. -NS 150 mL/hr  -BMP in am  Diet: Regular VTE ppx: [place] Heparin FULL CODE  Dispo: Disposition is deferred at this time, awaiting improvement of current medical problems. Anticipated discharge in approximately 2-3 day(s).   The patient does have a current PCP (No primary care provider on file.) and [facility] after discharge.  The patient does not have transportation limitations that hinder transportation to clinic appointments.   LOS: 0 days   Fuller Planhristopher W Haydon Kalmar, MD 03/24/2015, 1:57 PM

## 2015-03-24 NOTE — H&P (Signed)
Date: 03/24/2015               Patient Name:  Gary Davies MRN: [id number]  DOB: [birth year] Age / Sex: 55 y.o., male   PCP: No primary care provider on file.         Medical Service: Internal Medicine Teaching Service         Attending Physician: Dr. Tyson Alias, MD    First Contact: Dr. Orest Dikes Pager: [phone]  Second Contact: Dr. Gara Kroner Pager: [phone]       After Hours (After 5p/  First Contact Pager: [phone]  weekends / holidays): Second Contact Pager: [phone]   Chief Complaint: Fevers, Chills, Hematuria  History of Present Illness: Mr. Crotteau is a 55 year old male with a past medical history of HTN, low testosterone on supplementation presenting to the [facility] ED today with complaints of fever, chills and hematuria. Patient reports for the past two weeks he has had episodes of fevers, chills, diaphoresis and confusion. He states that these episodes last for 5-6 hours and are self-limited. He reports fevers to 102 during this time. States he has had 8-9 episodes over the past 2 weeks with the last episode occuring this afternoon. No specific time these episodes occur. He reports that yesterday he began having 'prostate' pain followed by burning pain in his penis with associated gross hematuria. He reports another episode of hematuria this morning. States that during these episodes he become confused and altered. Wife reported to the ED that he was very altered today and brought him to the ED. Wife not present at bedside when called to admit. Patient reports an episode of chicken pox and shingles that occurred concurrently 8 weeks ago (reports this was his 5 time with chicken pox) followed by an episode of scrotal cellulitis shortly afterwards. Patient reports he feels like his immune system is not working properly. Denies any chest pain, shortness of breath, palpations, headache, blurred vision, neck pain, weakness, numbness, abdominal pain, flank pain, nausea, vomiting,  diarrhea, rash, new lesions, dysuria, hematochezia, melena. Reports he has been monogamous with his wife for the past 27 years with no history of STDs.   Meds: Current Facility-Administered Medications  Medication Dose Route Frequency Provider Last Rate Last Dose  . 0.9 %  sodium chloride infusion   Intravenous Continuous Ejiroghene Wendall Stade, MD 150 mL/hr at 03/24/15 0149    . ciprofloxacin (CIPRO) IVPB 400 mg  400 mg Intravenous Q12H Stevphen [place], [facility]       Current Outpatient Prescriptions  Medication Sig Dispense Refill  . Ascorbic Acid (VITAMIN C) 1000 MG tablet Take 1,000 mg by mouth daily as needed (for immune).    Marland Kitchen aspirin 325 MG EC tablet Take 975 mg by mouth every 6 (six) hours as needed for pain or fever.    Marland Kitchen ibuprofen (ADVIL,MOTRIN) 200 MG tablet Take 600 mg by mouth every 6 (six) hours as needed for fever or moderate pain.    . magnesium oxide (MAG-OX) 400 MG tablet Take 400 mg by mouth daily.    . Omega-3 Fatty Acids (FISH OIL) 1000 MG CAPS Take 3,000 mg by mouth every evening.     . Testosterone (AXIRON) 30 MG/ACT SOLN Place 2 application onto the skin 2 (two) times daily. Applies to each armpit.    Marland Kitchen HYDROcodone-acetaminophen (NORCO) 5-325 MG per tablet Take 1-2 tablets by mouth every 4 (four) hours as needed for severe pain. (Patient not taking: Reported on 06/20/2014) 12  tablet 0    Allergies: Allergies as of 03/23/2015 - Review Complete 03/23/2015  Allergen Reaction Noted  . Amoxicillin Swelling and Other (See Comments) 08/08/2013  . Keflex [cephalexin] Other (See Comments) 03/23/2015   Past Medical History  Diagnosis Date  . Hypertension   . Chicken pox   . Shingles   . Cellulitis    Past Surgical History  Procedure Laterality Date  . Uvulopalatopharyngoplasty    . Foreign body removal rectal N/A 06/20/2014    Procedure: REMOVAL FOREIGN BODY RECTAL;  Surgeon: Ovidio Kinavid Newman, MD;  Location: [facility];  Service: General;  Laterality: N/A;   No family history on  file. Social History   Social History  . Marital Status: Married    Spouse Name: N/A  . Number of Children: N/A  . Years of Education: N/A   Occupational History  . Not on file.   Social History Main Topics  . Smoking status: Current Every Day Smoker  . Smokeless tobacco: Not on file  . Alcohol Use: No  . Drug Use: No  . Sexual Activity: Not on file   Other Topics Concern  . Not on file   Social History Narrative    Review of Systems: Pertinent items noted in HPI and remainder of comprehensive ROS otherwise negative.  Physical Exam: Blood pressure 120/73, pulse 62, temperature 100.2 F (37.9 C), resp. rate 17, height 5\' 9"  (1.753 m), weight 231 lb 4 oz (104.894 kg), SpO2 97 %. General: alert, well-developed, and cooperative to examination.  Head: normocephalic and atraumatic.  Eyes: vision grossly intact, pupils equal, pupils round, pupils reactive to light, no injection and anicteric.  Mouth: pharynx pink and moist, no erythema, and no exudates.  Neck: supple, full ROM, no thyromegaly, no JVD, and no carotid bruits.  Lungs: normal respiratory effort, no accessory muscle use, normal breath sounds, no crackles, and no wheezes. Heart: normal rate, regular rhythm, no murmur, no gallop, and no rub.  Abdomen: soft, non-tender, normal bowel sounds, no distention, no guarding, no rebound tenderness GU: No rashes or lesions present. Vitiligo lesions present on dorsum of penile shaft. No penile discharge. DRE with non-tender prostate.  Msk: no joint swelling, no joint warmth, and no redness over joints.  Pulses: 2+ DP/PT pulses bilaterally Extremities: No cyanosis, clubbing, edema Neurologic: alert & oriented X3, cranial nerves II-XII intact, no focal deficits Skin: turgor normal and no rashes.  Psych: normal mood and affect  Lab results: Basic Metabolic Panel:  Recent Labs  16/10/9601/19/17 1816  NA 139  K 4.4  CL 104  CO2 24  GLUCOSE 97  BUN 19  CREATININE 2.02*  CALCIUM  9.2   Liver Function Tests:  Recent Labs  03/23/15 1816  AST 43*  ALT 52  ALKPHOS 113  BILITOT 0.6  PROT 6.2*  ALBUMIN 3.2*   CBC:  Recent Labs  03/23/15 1816  WBC 18.6*  NEUTROABS 17.8*  HGB 15.0  HCT 45.7  MCV 96.8  PLT 220   Urinalysis:  Recent Labs  03/23/15 1926  COLORURINE AMBER*  LABSPEC 1.021  PHURINE 5.0  GLUCOSEU NEGATIVE  HGBUR NEGATIVE  BILIRUBINUR NEGATIVE  KETONESUR NEGATIVE  PROTEINUR NEGATIVE  NITRITE POSITIVE*  LEUKOCYTESUR SMALL*   Imaging results:  Dg Chest 2 View  03/23/2015  CLINICAL DATA:  Fevers, recent varicella infection EXAM: CHEST  2 VIEW COMPARISON:  06/10/14 FINDINGS: The heart size and mediastinal contours are within normal limits. Both lungs are clear. The visualized skeletal structures are unremarkable. IMPRESSION: No  active cardiopulmonary disease. Electronically Signed   By: Alcide Clever M.D.   On: 03/23/2015 18:37   Assessment & Plan by Problem:  UTI: Patient with 2 week history of fevers and chills. Noted 'prostate' pain with penile buring and two episodes of gross hematuria that began yesterday. Denies any current symptoms of dysuria. Reports fevers to 102 at home. Tmax 100.2 in the ED. Leukocytosis to 18.6 with a lactic acidosis of 3.25. CXR unremarkable. UA positive nitrite, small leuks, 6-30 WBC, few bacteria. No Hgb, no RBC present. Denies any flank pain or history of kidney stones. Reports monogamous relationship with his wife and no history of STDs. Denies any penile discharge, lesions or sores. Patient was [facility] at [facility] 08/2013 for rectal foreign body removal at that time. He denies any MSM. No prostate tenderness on DRE. Unclear etiology for UTI at this time. Will monitor for any further hematuria and repeat UA if necessary. Patient does smoke 1 PPD for the past 8 years.  -Cipro per pharmacy -NS 150 mL/hr -CBC in am -F/U urine culture -F/U blood cultures -Urine GC/Chlamydia  -RPR -Rapid HIV negative -Trend  lactate  Recurrent Fevers: Patient with 2 week history of recurrent fevers and chills. Most likely secondary to UTI. Patient reports history of chicken pox x 5 with recent shingles infection and scrotal cellulitis. If symptoms do not improve with treatment or urine culture unrevealing will consider further work up for possible immodeficiecny. Patient is originally from [place] and moved to [place] in 1997. Has been in the [place] since 2007.  -Flu PCR -Rapid HIV negative  AKI: Cr 2.02 on arrival. Prior admission 06/2014 Cr was 1.28. Likely pre-renal from dehydration. Patient reports poor PO intake. Will give IVF and recheck in am. Consider renal US if no improvement with IVF.  -NS 150 mL/hr  -BMP in am  HTN: BP stable in the ED. Patient not on any home medications. Continue to monitor.   DVT PPx: SQ Heparin  Dispo: Disposition is deferred at this time, awaiting improvement of current medical problems. Anticipated discharge in approximately 2-3 day(s).   The patient does have a current PCP (No primary care provider on file.) and does need an [place] hospital follow-up appointment after discharge.  The patient does not have transportation limitations that hinder transportation to clinic appointments.  Signed: Valentino Nose, MD 03/24/2015, 3:15 AM

## 2015-03-25 DIAGNOSIS — N179 Acute kidney failure, unspecified: Secondary | ICD-10-CM

## 2015-03-25 DIAGNOSIS — N390 Urinary tract infection, site not specified: Secondary | ICD-10-CM

## 2015-03-25 DIAGNOSIS — B9620 Unspecified Escherichia coli [E. coli] as the cause of diseases classified elsewhere: Secondary | ICD-10-CM

## 2015-03-25 LAB — BASIC METABOLIC PANEL
ANION GAP: 13 (ref 5–15)
BUN: 19 mg/dL (ref 6–20)
CALCIUM: 9 mg/dL (ref 8.9–10.3)
CHLORIDE: 106 mmol/L (ref 101–111)
CO2: 23 mmol/L (ref 22–32)
CREATININE: 1.37 mg/dL — AB (ref 0.61–1.24)
GFR, EST NON AFRICAN AMERICAN: 58 mL/min — AB (ref >60)
Glucose, Bld: 128 mg/dL — ABNORMAL HIGH (ref 65–99)
Potassium: 4.6 mmol/L (ref 3.5–5.1)
SODIUM: 142 mmol/L (ref 135–145)

## 2015-03-25 LAB — CBC
HEMATOCRIT: 43.8 % (ref 39.0–52.0)
HEMOGLOBIN: 14.5 g/dL (ref 13.0–17.0)
MCH: 31.7 pg (ref 26.0–34.0)
MCHC: 33.1 g/dL (ref 30.0–36.0)
MCV: 95.8 fL (ref 78.0–100.0)
Platelets: 250 10*3/uL (ref 150–400)
RBC: 4.57 MIL/uL (ref 4.22–5.81)
RDW: 15.3 % (ref 11.5–15.5)
WBC: 14.4 10*3/uL — AB (ref 4.0–10.5)

## 2015-03-25 LAB — URINE CULTURE: Culture: >=100000

## 2015-03-25 MED ORDER — SULFAMETHOXAZOLE-TRIMETHOPRIM 800-160 MG PO TABS
1.0000 | ORAL_TABLET | Freq: Two times a day (BID) | ORAL | Status: DC
  Administered 2015-03-26: 1 via ORAL
  Filled 2015-03-25: qty 1

## 2015-03-25 NOTE — Progress Notes (Signed)
   Subjective: Patient afebrile overnight and he reports his sweats have resolved since yesterday afternoon. Overall he is feeling much better and walking many laps around the unit. He denies hematuria and dysuria.  Objective: Vital signs in last 24 hours: Filed Vitals:   03/24/15 0344 03/24/15 1400 03/24/15 2049 03/25/15 0500  BP: 120/67 117/68 141/69 124/77  Pulse: 77 71 74 59  Temp: 98 F (36.7 C) 98.4 F (36.9 C) 98.4 F (36.9 C) 98.2 F (36.8 C)  TempSrc: Axillary  Oral   Resp: 18 18 18 18   Height: 5\' 9"  (1.753 m)     Weight: 104.781 kg (231 lb)     SpO2: 97% 98% 98% 96%   Weight change:   Intake/Output Summary (Last 24 hours) at 03/25/15 1036 Last data filed at 03/25/15 0845  Gross per 24 hour  Intake   1950 ml  Output      0 ml  Net   1950 ml   GENERAL- alert, co-operative, NAD HEENT- Atraumatic, oral mucosa appears moist CARDIAC- RRR, no murmurs, rubs or gallops. RESP- CTAB, no wheezes or crackles. EXTREMITIES- symmetric, no pedal edema. SKIN- Warm, dry PSYCH- Normal mood and affect, appropriate thought content and speech.  Medications: I have reviewed the patient's current medications. Scheduled Meds: . cefTRIAXone (ROCEPHIN)  IV  2 g Intravenous Q24H  . heparin subcutaneous  5,000 Units Subcutaneous 3 times per day  . Melatonin  1.5 mg Oral Once   Continuous Infusions:  PRN Meds:. Assessment/Plan: GNR Bacteremia 2/2 UTI: Now afebrile. E. Coli in urine culture. Blood GNRs can be presumed same organism. Susceptible to several agents may consider oral bactrim if AKI is improved with rehydration. Leukocytosis improving after initial increase yesterday. Also he states last UTI was 6 years ago but given severity and history of several UTIs in a 55 y/o man we will check PVR as this could be secondary to outlet flow obstruction from BPH. Obstructive nephropathy might also explain elevated baseline SCr. Fortunately he has clinically improved very quickly on IV  antibiotics and can hopefully transition to an oral agent tomorrow. -IV ceftriaxone 1g daily -Repeat Blood Cx x2 today -PVR today  AKI: Improving with fluids, will check one more time for resolution of AKI to baseline with clinical improvement while on IV antibiotics. His elevated baseline (~1.28 last year) may be attributable at least in part to body habitus (104kg with large muscle bulk).  Diet: Regular VTE ppx: [place] Heparin FULL CODE  Dispo: Disposition is deferred at this time, awaiting improvement of current medical problems. Anticipated discharge in approximately 1-2 day(s).   The patient does have a current PCP (No primary care provider on file.) and [facility].  The patient does not have transportation limitations that hinder transportation to clinic appointments.   LOS: 1 day   Fuller Planhristopher W Ellison Leisure, MD 03/25/2015, 10:36 AM

## 2015-03-26 LAB — CULTURE, BLOOD (ROUTINE X 2)

## 2015-03-26 MED ORDER — SULFAMETHOXAZOLE-TRIMETHOPRIM 800-160 MG PO TABS: 1.0000 | ORAL_TABLET | Freq: Two times a day (BID) | ORAL | Status: AC

## 2015-03-26 NOTE — Progress Notes (Signed)
Nsg Discharge Note  Admit Date:  03/23/2015 Discharge date: 03/26/2015   Gary Davies to be D/C'd home per MD order.  AVS completed.  Copy for chart, and copy for patient signed, and dated. Patient/spouse able to verbalize understanding.  Discharge Medication:   Medication List    STOP taking these medications        HYDROcodone-acetaminophen 5-325 MG tablet  Commonly known as:  NORCO      TAKE these medications        aspirin 325 MG EC tablet  Take 975 mg by mouth every 6 (six) hours as needed for pain or fever.     AXIRON 30 MG/ACT Soln  Generic drug:  Testosterone  Place 2 application onto the skin 2 (two) times daily. Applies to each armpit.     Fish Oil 1000 MG Caps  Take 3,000 mg by mouth every evening.     ibuprofen 200 MG tablet  Commonly known as:  ADVIL,MOTRIN  Take 600 mg by mouth every 6 (six) hours as needed for fever or moderate pain.     magnesium oxide 400 MG tablet  Commonly known as:  MAG-OX  Take 400 mg by mouth daily.     sulfamethoxazole-trimethoprim 800-160 MG tablet  Commonly known as:  BACTRIM DS,SEPTRA DS  Take 1 tablet by mouth every 12 (twelve) hours.     vitamin C 1000 MG tablet  Take 1,000 mg by mouth daily as needed (for immune).        Discharge Assessment: Filed Vitals:   03/25/15 2259 03/26/15 1234  BP: 130/68 144/84  Pulse: 56 54  Temp: 98.6 F (37 C) 98.9 F (37.2 C)  Resp: 16 16   D/c Instructions-Education: Discharge instructions given to patient/spouse with verbalized understanding. D/c education completed with patient/spouse including follow up instructions, medication list, d/c activities limitations if indicated, with other d/c instructions as indicated by MD - patient able to verbalize understanding, all questions fully answered. Patient instructed to return to ED, call 911, or call MD for any changes in condition.  Patient escorted via WC, and D/C home via private auto.  Gary Chadracy Sheva Mcdougle, RN 03/26/2015  1310

## 2015-03-26 NOTE — Progress Notes (Signed)
   Subjective: Patient feeling well today and eager to go home. Denies any symptomatic complaints this morning. Last dose IV ceftriaxone yesterday now taking Bactrim.  Objective: Vital signs in last 24 hours: Filed Vitals:   03/24/15 2049 03/25/15 0500 03/25/15 1308 03/25/15 2259  BP: 141/69 124/77 130/61 130/68  Pulse: 74 59 62 56  Temp: 98.4 F (36.9 C) 98.2 F (36.8 C) 98.8 F (37.1 C) 98.6 F (37 C)  TempSrc: Oral  Oral Tympanic  Resp: 18 18 16 16   Height:      Weight:      SpO2: 98% 96% 97% 95%   Weight change:   Intake/Output Summary (Last 24 hours) at 03/26/15 0958 Last data filed at 03/25/15 1831  Gross per 24 hour  Intake    720 ml  Output      0 ml  Net    720 ml   GENERAL- alert, co-operative, NAD HEENT- Atraumatic, oral mucosa appears moist EXTREMITIES- symmetric, no pedal edema. SKIN- Warm, dry PSYCH- Normal mood and affect, appropriate thought content and speech.  Medications: I have reviewed the patient's current medications. Scheduled Meds: . Melatonin  1.5 mg Oral Once  . sulfamethoxazole-trimethoprim  1 tablet Oral Q12H   Continuous Infusions:  PRN Meds:. Assessment/Plan: E. Coli Bacteremia 2/2 UTI: Susceptible to bactrim on urine cx, transition to oral abtx today. Waiting to see blood cultures return negative x1 day but I expect clearance given his rapid clinical improvement and this being an E. Coli bacteremia with no lines or prostheses requiring additional consideration. In addition to follow up with his PCP we will also refer to Urology since it is uncommon for recurrent UTI in a 55 y/o man especially with complication of bacteremia. His external genitourinary anatomy is somewhat abnormal on examination, no Hx of significant surgeries, no known obstructive nephropathy at this time. -Bactrim DS BID to complete 14 days total abtx (to 04/05/15) -F/U Blood Cx  Diet: Regular VTE ppx: [place] Heparin FULL CODE  Dispo: Anticipate discharge to home early  this afternoon  The patient does have a current PCP (Dr. Maryelizabeth RowanElizabeth Dewey) and [facility] after discharge.  The patient does not have transportation limitations that hinder transportation to clinic appointments.   LOS: 2 days   Gary Planhristopher W Dresden Ament, MD 03/26/2015, 9:58 AM

## 2015-03-26 NOTE — Discharge Summary (Signed)
Name: Yehuda MaoJohan Mcknight MRN: [id number] DOB: 01/28/1960 55 y.o. PCP: Lewis MoccasinElizabeth R Dewey, MD  Date of Admission: 03/23/2015  5:28 PM Date of Discharge: 03/26/2015 Attending Physician: Dr. Erlinda Honguncan Vincent  Discharge Diagnosis: E. Coli bacteremia 2/2 urinary tract infection Acute kidney injury  Discharge Medications:   Medication List    STOP taking these medications        HYDROcodone-acetaminophen 5-325 MG tablet  Commonly known as:  NORCO      TAKE these medications        aspirin 325 MG EC tablet  Take 975 mg by mouth every 6 (six) hours as needed for pain or fever.     AXIRON 30 MG/ACT Soln  Generic drug:  Testosterone  Place 2 application onto the skin 2 (two) times daily. Applies to each armpit.     Fish Oil 1000 MG Caps  Take 3,000 mg by mouth every evening.     ibuprofen 200 MG tablet  Commonly known as:  ADVIL,MOTRIN  Take 600 mg by mouth every 6 (six) hours as needed for fever or moderate pain.     magnesium oxide 400 MG tablet  Commonly known as:  MAG-OX  Take 400 mg by mouth daily.     sulfamethoxazole-trimethoprim 800-160 MG tablet  Commonly known as:  BACTRIM DS,SEPTRA DS  Take 1 tablet by mouth every 12 (twelve) hours.     vitamin C 1000 MG tablet  Take 1,000 mg by mouth daily as needed (for immune).        Disposition and follow-up:   Mr.Bejamin Se[facility]vert [facility].  At the hospital follow up visit please address:  E. Coli bacteremia from urinary tract infection: Asymptomatic at discharge on 2 weeks total antibiotics to complete 04/05/15. The patient reports a history of previous UTI requiring treatment 6 years ago, and was recently treated for a scrotal cellulitis 8 weeks ago. We placed a referral to outpatient Urology for concern he may have outflow tract or other GU abnormality contributing to these events in a [age] y/o man with minimal other medical history.  Follow-up Appointments: Follow-up  Information    Follow up with [facility] On 03/27/2015.   Why:  at 9:30am. Please do not miss this appointment, the next available would be in May   Contact information:   [address] [place] [id number] [phone]      Follow up with [facility], MD. Schedule an appointment as soon as possible for a visit in 2 weeks.   Specialty:  Family Medicine   Why:  As needed   Contact information:   [address] [place] [id number] [phone]       Discharge Instructions: Discharge Instructions    Ambulatory referral to Urology    Complete by:  As directed   Recurrent urinary tract infection in man, causing E Coli bacteremia           Consultations:    Procedures Performed:  Dg Chest 2 View  03/23/2015  CLINICAL DATA:  Fevers, recent varicella infection EXAM: CHEST  2 VIEW COMPARISON:  06/10/14 FINDINGS: The heart size and mediastinal contours are within normal limits. Both lungs are clear. The visualized skeletal structures are unremarkable. IMPRESSION: No active cardiopulmonary disease. Electronically Signed   By: Alcide CleverMark  Lukens M.D.   On: 03/23/2015 18:37   Admission HPI: Mr. Armida SansGrundlingh is a 55 year old male with a past medical  history of HTN, low testosterone on supplementation presenting to the [facility] ED today with complaints of fever, chills and hematuria. Patient reports for the past two weeks he has had episodes of fevers, chills, diaphoresis and confusion. He states that these episodes last for 5-6 hours and are self-limited. He reports fevers to 102 during this time. States he has had 8-9 episodes over the past 2 weeks with the last episode occuring this afternoon. No specific time these episodes occur. He reports that yesterday he began having 'prostate' pain followed by burning pain in his penis with associated gross hematuria. He reports another episode of hematuria this morning. States that during these episodes he become confused  and altered. Wife reported to the ED that he was very altered today and brought him to the ED. Wife not present at bedside when called to admit. Patient reports an episode of chicken pox and shingles that occurred concurrently 8 weeks ago (reports this was his 5 time with chicken pox) followed by an episode of scrotal cellulitis shortly afterwards. Patient reports he feels like his immune system is not working properly. Denies any chest pain, shortness of breath, palpations, headache, blurred vision, neck pain, weakness, numbness, abdominal pain, flank pain, nausea, vomiting, diarrhea, rash, new lesions, dysuria, hematochezia, melena. Reports he has been monogamous with his wife for the past 27 years with no history of STDs.  Hospital Course by problem list: E. Coli bacteremia 2/2 urinary track infection: Blood and urine cultures obtained in the ED and empirically started on ciprofloxacin in the setting of some B-lactam allergies and AKI. Febrile with sweats overnight after admission but vital signs remained otherwise stable. He continued to be symptomatic of profuse sweats until the afternoon on hospital day 1. Cultures returned positive for GNRs in both blood and urine and antibiotic was changed to IV ceftriaxone. On day 2 his symptoms resolved. Blood cultures were drawn and blood chemistry repeated to confirm resolution of bacteremia and AKI. He was transitioned to PO bactrim according to bacterial susceptibility data to complete a total 14 day abtx.  Acute kidney injury: SCr 2.02 on admission up from past measurement at 1.28 in 06/2014. This was most likely related to dehydration as it improved rapidly overnight with intravenous fluids. It is likely SCr underestimates renal function in this patient due to his body composition contains large muscle bulk.  Discharge Vitals:   BP 144/84 mmHg  Pulse 54  Temp(Src) 98.9 F (37.2 C) (Oral)  Resp 16  Ht  (1.753 m)  Wt 104.781 kg (231 lb)  BMI 34.10  kg/m2  SpO2 99%  Discharge Labs:  No results found for this or any previous visit (from the past 24 hour(s)).  Signed: Fuller Plan, MD 03/27/2015, 2:57 PM

## 2015-03-26 NOTE — Discharge Instructions (Signed)
You will need to continue taking Bactrim twice daily to be completed on April 1. We recommend scheduling a follow up appointment with your PCP around that time or as needed.  We have also arranged an appointment with [facility] TOMORROW 9:30am to better evaluate your urinary tract infection and symptoms.

## 2015-03-30 LAB — CULTURE, BLOOD (ROUTINE X 2)
CULTURE: NO GROWTH
Culture: NO GROWTH

## 2015-05-13 IMAGING — CR DG RIBS W/ CHEST 3+V BILAT
5 series · 5 of 5 positions shown · non-contrast
Comparison: August 08, 2013.

CLINICAL DATA: Chest pain after trauma.

EXAM:
BILATERAL RIBS AND CHEST - 4+ VIEW

[view not recorded (1 of 5)]
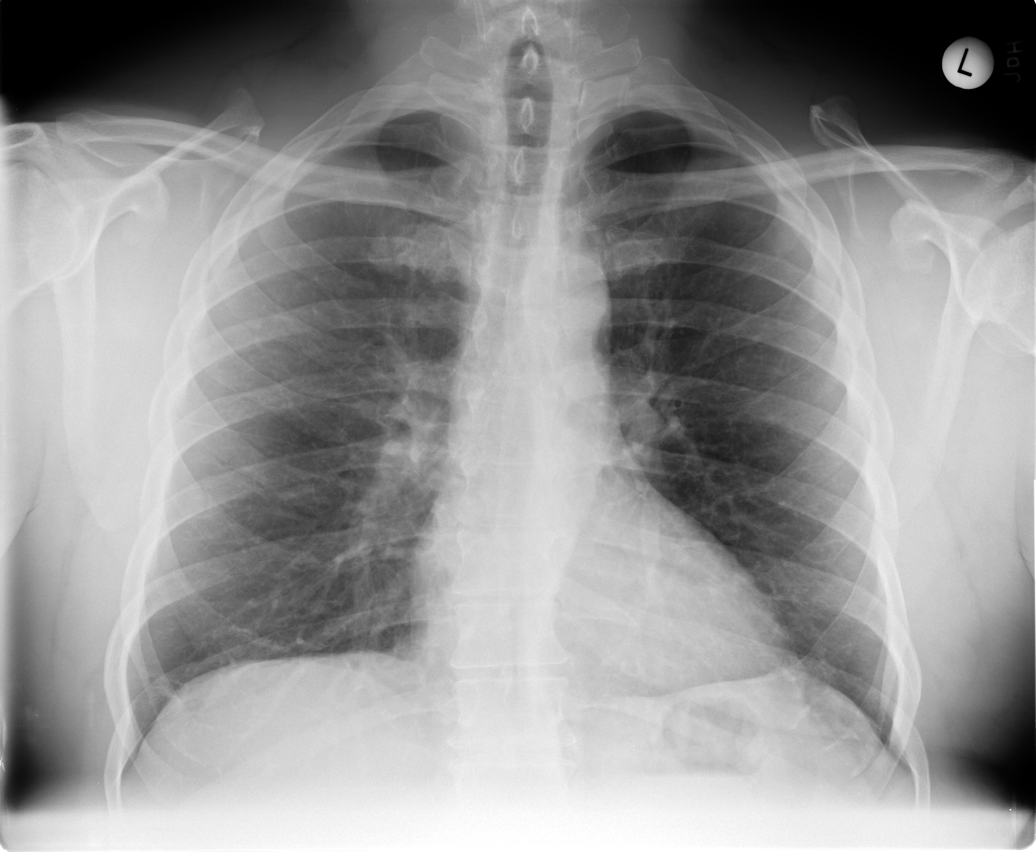

[view not recorded (2 of 5)]
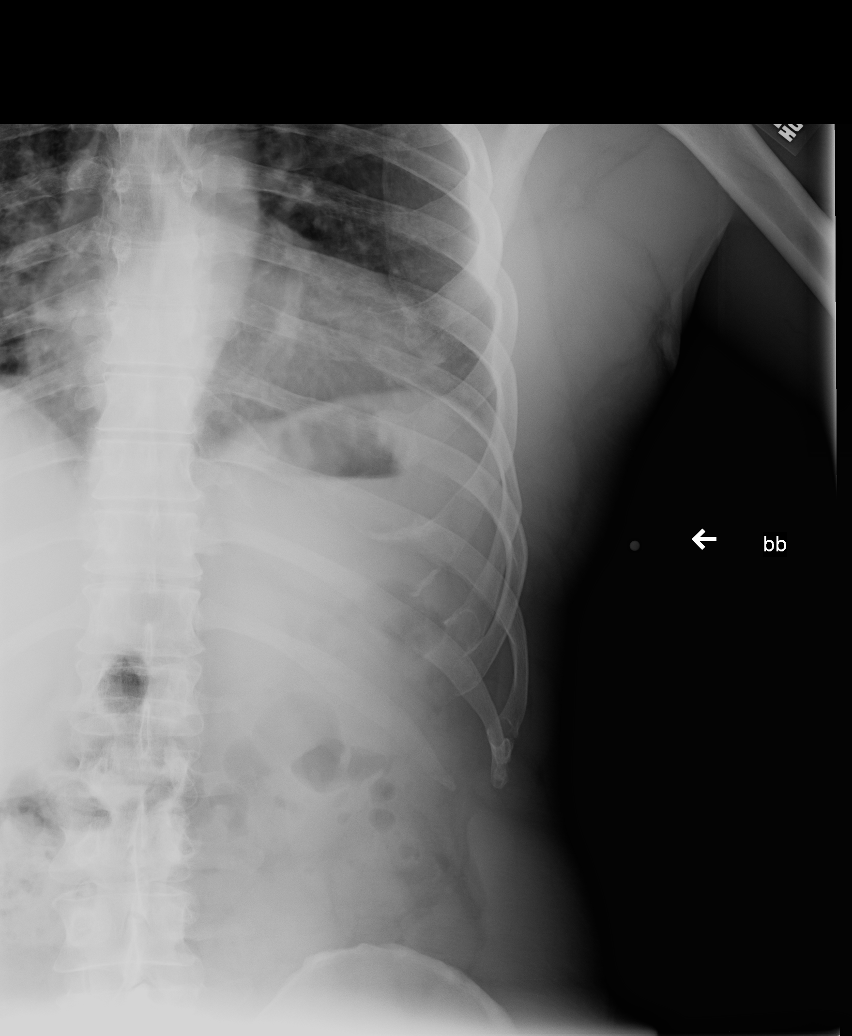

[view not recorded (3 of 5)]
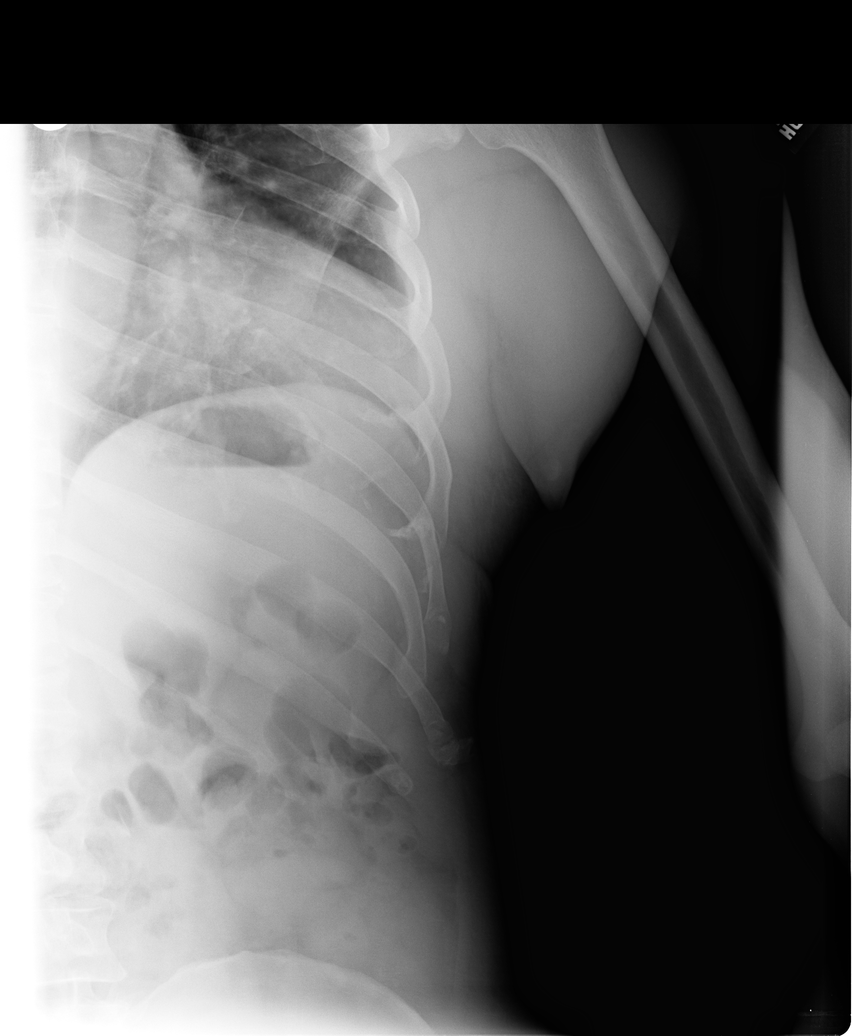

[view not recorded (4 of 5)]
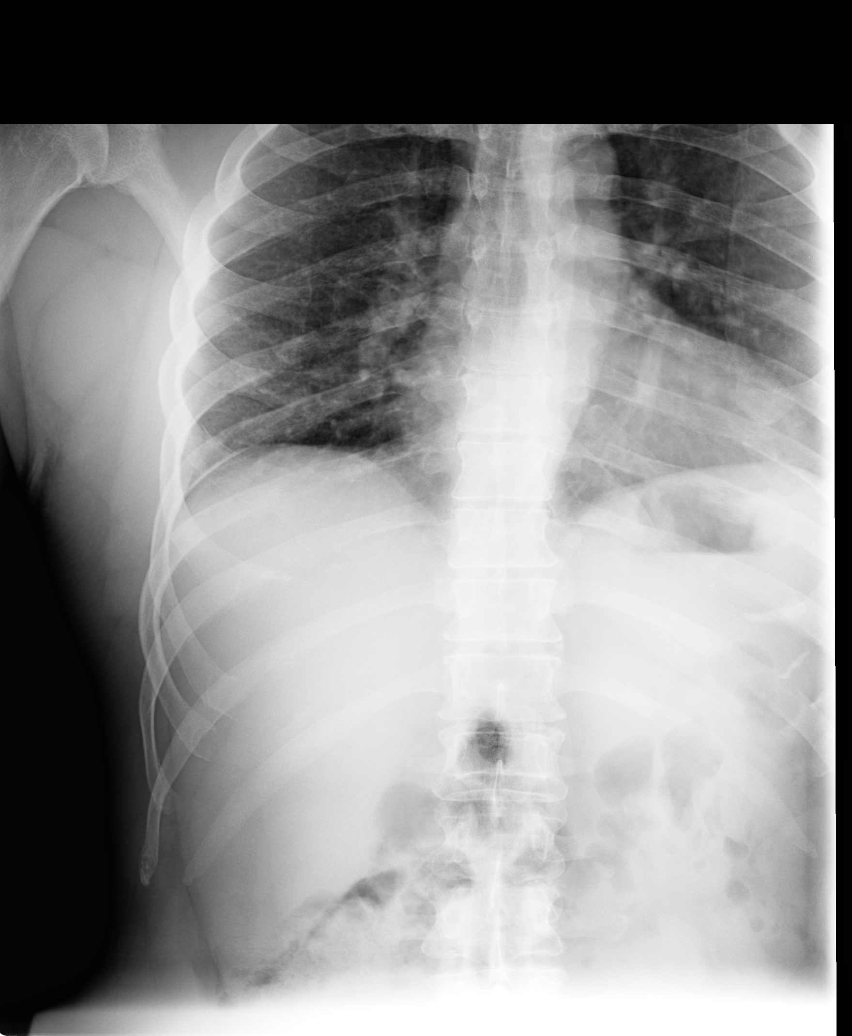

[view not recorded (5 of 5)]
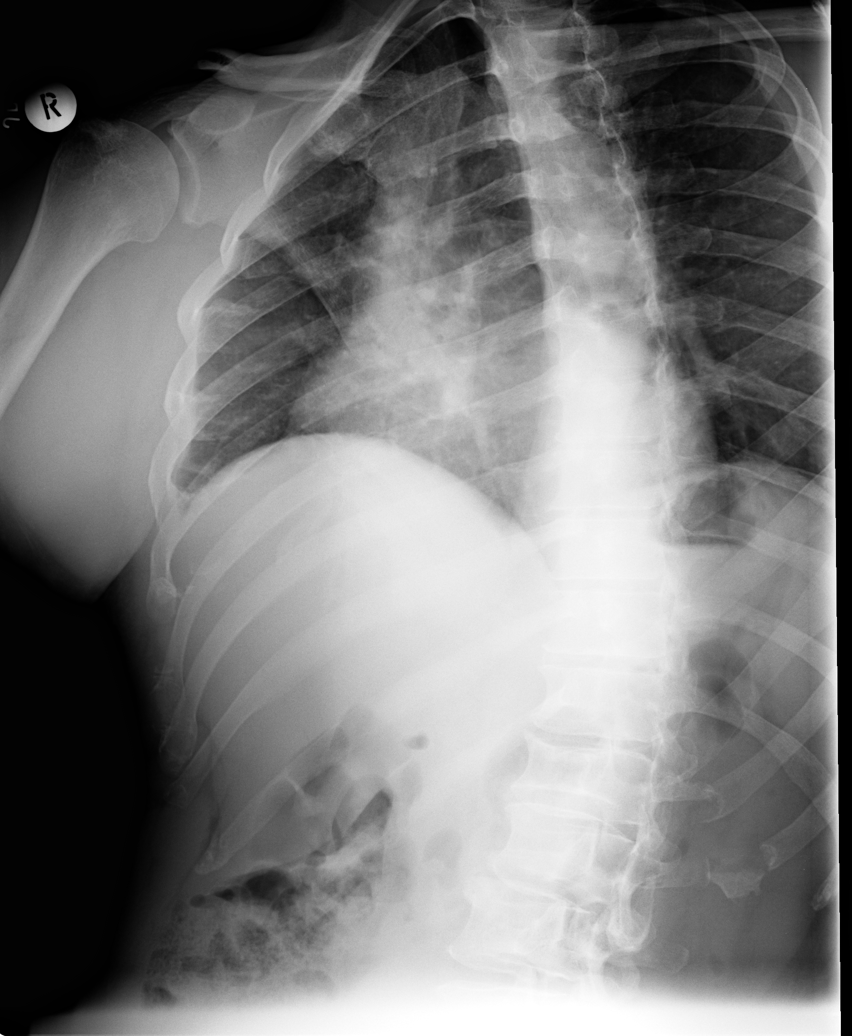

[5 of 5 positions shown; findings below may reference images not displayed]

FINDINGS: Nondisplaced fracture is seen involving the anterior and lateral
portion of the right sixth rib. Also noted is mildly displaced
fracture involving the anterior portion of the left tenth rib. There
is no evidence of pneumothorax or pleural effusion. Both lungs are
clear. Heart size and mediastinal contours are within normal limits.
IMPRESSION: Fractures involving the right sixth and left tenth ribs.

## 2015-10-29 ENCOUNTER — Encounter (HOSPITAL_COMMUNITY): Payer: Self-pay

## 2015-10-29 DIAGNOSIS — Z880 Allergy status to penicillin: Secondary | ICD-10-CM | POA: Diagnosis not present

## 2015-10-29 DIAGNOSIS — F17200 Nicotine dependence, unspecified, uncomplicated: Secondary | ICD-10-CM

## 2015-10-29 DIAGNOSIS — N3020 Other chronic cystitis without hematuria: Secondary | ICD-10-CM | POA: Diagnosis not present

## 2015-10-29 DIAGNOSIS — I10 Essential (primary) hypertension: Secondary | ICD-10-CM | POA: Insufficient documentation

## 2015-10-29 DIAGNOSIS — K5732 Diverticulitis of large intestine without perforation or abscess without bleeding: Secondary | ICD-10-CM | POA: Diagnosis not present

## 2015-10-29 DIAGNOSIS — K529 Noninfective gastroenteritis and colitis, unspecified: Secondary | ICD-10-CM | POA: Diagnosis present

## 2015-10-29 DIAGNOSIS — Z881 Allergy status to other antibiotic agents status: Secondary | ICD-10-CM | POA: Diagnosis not present

## 2015-10-29 DIAGNOSIS — R1031 Right lower quadrant pain: Secondary | ICD-10-CM

## 2015-10-29 DIAGNOSIS — Z5321 Procedure and treatment not carried out due to patient leaving prior to being seen by health care provider: Secondary | ICD-10-CM

## 2015-10-29 DIAGNOSIS — Z800 Family history of malignant neoplasm of digestive organs: Secondary | ICD-10-CM | POA: Diagnosis not present

## 2015-10-29 LAB — URINALYSIS, ROUTINE W REFLEX MICROSCOPIC
Bilirubin Urine: NEGATIVE
Glucose, UA: NEGATIVE mg/dL
Hgb urine dipstick: NEGATIVE
Ketones, ur: NEGATIVE mg/dL
LEUKOCYTES UA: NEGATIVE
Nitrite: NEGATIVE
PROTEIN: NEGATIVE mg/dL
Specific Gravity, Urine: 1.021 (ref 1.005–1.030)
pH: 5.5 (ref 5.0–8.0)

## 2015-10-29 LAB — COMPREHENSIVE METABOLIC PANEL
ALT: 45 U/L (ref 17–63)
AST: 37 U/L (ref 15–41)
Albumin: 3.9 g/dL (ref 3.5–5.0)
Alkaline Phosphatase: 49 U/L (ref 38–126)
Anion gap: 8 (ref 5–15)
BUN: 19 mg/dL (ref 6–20)
CALCIUM: 9 mg/dL (ref 8.9–10.3)
CHLORIDE: 106 mmol/L (ref 101–111)
CO2: 25 mmol/L (ref 22–32)
CREATININE: 1.3 mg/dL — AB (ref 0.61–1.24)
Glucose, Bld: 101 mg/dL — ABNORMAL HIGH (ref 65–99)
Potassium: 4.1 mmol/L (ref 3.5–5.1)
Sodium: 139 mmol/L (ref 135–145)
Total Bilirubin: 0.9 mg/dL (ref 0.3–1.2)
Total Protein: 6.4 g/dL — ABNORMAL LOW (ref 6.5–8.1)

## 2015-10-29 LAB — CBC
HCT: 48.1 % (ref 39.0–52.0)
Hemoglobin: 17 g/dL (ref 13.0–17.0)
MCH: 32.9 pg (ref 26.0–34.0)
MCHC: 35.3 g/dL (ref 30.0–36.0)
MCV: 93.2 fL (ref 78.0–100.0)
PLATELETS: 224 10*3/uL (ref 150–400)
RBC: 5.16 MIL/uL (ref 4.22–5.81)
RDW: 13.7 % (ref 11.5–15.5)
WBC: 13.5 10*3/uL — AB (ref 4.0–10.5)

## 2015-10-29 LAB — LIPASE, BLOOD: LIPASE: 28 U/L (ref 11–51)

## 2015-10-29 NOTE — ED Triage Notes (Signed)
Pt states for about the past 5 days has been having RLQ pain, worse today, pain radiates all over lowr abd and into groin area.  Denies n/v/d, some painful urination

## 2015-10-30 ENCOUNTER — Encounter (HOSPITAL_BASED_OUTPATIENT_CLINIC_OR_DEPARTMENT_OTHER): Payer: Self-pay | Admitting: Emergency Medicine

## 2015-10-30 ENCOUNTER — Emergency Department (HOSPITAL_COMMUNITY)
Admission: EM | Admit: 2015-10-30 | Discharge: 2015-10-30 | Disposition: A | Discharge status: Home / Self Care | Payer: BLUE CROSS/BLUE SHIELD | Source: Home / Self Care

## 2015-10-30 ENCOUNTER — Emergency Department (HOSPITAL_BASED_OUTPATIENT_CLINIC_OR_DEPARTMENT_OTHER): Payer: BLUE CROSS/BLUE SHIELD

## 2015-10-30 ENCOUNTER — Observation Stay (HOSPITAL_BASED_OUTPATIENT_CLINIC_OR_DEPARTMENT_OTHER)
Admission: EM | Admit: 2015-10-30 | Discharge: 2015-10-31 | DRG: 392 | Disposition: A | Discharge status: Home / Self Care | Payer: BLUE CROSS/BLUE SHIELD | Attending: Internal Medicine | Admitting: Internal Medicine

## 2015-10-30 DIAGNOSIS — Z800 Family history of malignant neoplasm of digestive organs: Secondary | ICD-10-CM

## 2015-10-30 DIAGNOSIS — Z880 Allergy status to penicillin: Secondary | ICD-10-CM

## 2015-10-30 DIAGNOSIS — I10 Essential (primary) hypertension: Secondary | ICD-10-CM | POA: Diagnosis present

## 2015-10-30 DIAGNOSIS — N3020 Other chronic cystitis without hematuria: Secondary | ICD-10-CM | POA: Diagnosis present

## 2015-10-30 DIAGNOSIS — R1031 Right lower quadrant pain: Secondary | ICD-10-CM | POA: Diagnosis present

## 2015-10-30 DIAGNOSIS — Z881 Allergy status to other antibiotic agents status: Secondary | ICD-10-CM

## 2015-10-30 DIAGNOSIS — K5792 Diverticulitis of intestine, part unspecified, without perforation or abscess without bleeding: Secondary | ICD-10-CM | POA: Diagnosis present

## 2015-10-30 DIAGNOSIS — K529 Noninfective gastroenteritis and colitis, unspecified: Secondary | ICD-10-CM

## 2015-10-30 DIAGNOSIS — K5732 Diverticulitis of large intestine without perforation or abscess without bleeding: Principal | ICD-10-CM | POA: Diagnosis present

## 2015-10-30 LAB — CBC WITH DIFFERENTIAL/PLATELET
BASOS PCT: 0 %
Basophils Absolute: 0 10*3/uL (ref 0.0–0.1)
EOS ABS: 0.2 10*3/uL (ref 0.0–0.7)
EOS PCT: 2 %
HCT: 48.4 % (ref 39.0–52.0)
HEMOGLOBIN: 17.3 g/dL — AB (ref 13.0–17.0)
LYMPHS ABS: 1.9 10*3/uL (ref 0.7–4.0)
Lymphocytes Relative: 16 %
MCH: 33 pg (ref 26.0–34.0)
MCHC: 35.7 g/dL (ref 30.0–36.0)
MCV: 92.2 fL (ref 78.0–100.0)
Monocytes Absolute: 1.3 10*3/uL — ABNORMAL HIGH (ref 0.1–1.0)
Monocytes Relative: 11 %
NEUTROS PCT: 71 %
Neutro Abs: 8.4 10*3/uL — ABNORMAL HIGH (ref 1.7–7.7)
PLATELETS: 196 10*3/uL (ref 150–400)
RBC: 5.25 MIL/uL (ref 4.22–5.81)
RDW: 13.6 % (ref 11.5–15.5)
WBC: 11.8 10*3/uL — AB (ref 4.0–10.5)

## 2015-10-30 LAB — URINALYSIS, ROUTINE W REFLEX MICROSCOPIC
Bilirubin Urine: NEGATIVE
Glucose, UA: NEGATIVE mg/dL
Hgb urine dipstick: NEGATIVE
Ketones, ur: NEGATIVE mg/dL
LEUKOCYTES UA: NEGATIVE
NITRITE: NEGATIVE
PH: 6 (ref 5.0–8.0)
Protein, ur: NEGATIVE mg/dL
SPECIFIC GRAVITY, URINE: 1.008 (ref 1.005–1.030)

## 2015-10-30 LAB — COMPREHENSIVE METABOLIC PANEL
ALBUMIN: 4.2 g/dL (ref 3.5–5.0)
ALT: 40 U/L (ref 17–63)
ANION GAP: 7 (ref 5–15)
AST: 28 U/L (ref 15–41)
Alkaline Phosphatase: 44 U/L (ref 38–126)
BUN: 17 mg/dL (ref 6–20)
CHLORIDE: 104 mmol/L (ref 101–111)
CO2: 26 mmol/L (ref 22–32)
Calcium: 9.3 mg/dL (ref 8.9–10.3)
Creatinine, Ser: 1.15 mg/dL (ref 0.61–1.24)
GFR calc non Af Amer: >60 mL/min (ref >60)
GLUCOSE: 93 mg/dL (ref 65–99)
Potassium: 3.9 mmol/L (ref 3.5–5.1)
SODIUM: 137 mmol/L (ref 135–145)
Total Bilirubin: 1.1 mg/dL (ref 0.3–1.2)
Total Protein: 7.2 g/dL (ref 6.5–8.1)

## 2015-10-30 LAB — LIPASE, BLOOD: Lipase: 18 U/L (ref 11–51)

## 2015-10-30 MED ORDER — HYDRALAZINE HCL 20 MG/ML IJ SOLN: 10.0000 mg | INTRAMUSCULAR | Status: DC | PRN

## 2015-10-30 MED ORDER — CIPROFLOXACIN IN D5W 400 MG/200ML IV SOLN: 400.0000 mg | Freq: Once | INTRAVENOUS | Status: DC

## 2015-10-30 MED ORDER — ONDANSETRON HCL 4 MG/2ML IJ SOLN
4.0000 mg | Freq: Once | INTRAMUSCULAR | Status: AC
  Administered 2015-10-30: 4 mg via INTRAVENOUS
  Filled 2015-10-30: qty 2

## 2015-10-30 MED ORDER — VANCOMYCIN HCL 500 MG IV SOLR
INTRAVENOUS | Status: AC
  Filled 2015-10-30: qty 1500

## 2015-10-30 MED ORDER — SODIUM CHLORIDE 0.9 % IV SOLN
INTRAVENOUS | Status: AC
  Administered 2015-10-30: 21:00:00 via INTRAVENOUS

## 2015-10-30 MED ORDER — DEXTROSE 5 % IV SOLN
1.0000 g | Freq: Once | INTRAVENOUS | Status: AC
  Administered 2015-10-30: 1 g via INTRAVENOUS

## 2015-10-30 MED ORDER — SODIUM CHLORIDE 0.9 % IV BOLUS (SEPSIS)
1000.0000 mL | Freq: Once | INTRAVENOUS | Status: AC
  Administered 2015-10-30: 1000 mL via INTRAVENOUS

## 2015-10-30 MED ORDER — HYDROMORPHONE HCL 1 MG/ML IJ SOLN: 0.5000 mg | INTRAMUSCULAR | Status: DC | PRN

## 2015-10-30 MED ORDER — ENOXAPARIN SODIUM 60 MG/0.6ML ~~LOC~~ SOLN
50.0000 mg | SUBCUTANEOUS | Status: DC
  Filled 2015-10-30: qty 0.6

## 2015-10-30 MED ORDER — ENOXAPARIN SODIUM 60 MG/0.6ML ~~LOC~~ SOLN: 50.0000 mg | SUBCUTANEOUS | Status: DC

## 2015-10-30 MED ORDER — ACETAMINOPHEN 650 MG RE SUPP: 650.0000 mg | Freq: Four times a day (QID) | RECTAL | Status: DC | PRN

## 2015-10-30 MED ORDER — METRONIDAZOLE IN NACL 5-0.79 MG/ML-% IV SOLN
500.0000 mg | Freq: Three times a day (TID) | INTRAVENOUS | Status: DC
  Administered 2015-10-30 – 2015-10-31 (×2): 500 mg via INTRAVENOUS
  Filled 2015-10-30 (×2): qty 100

## 2015-10-30 MED ORDER — VANCOMYCIN HCL 10 G IV SOLR
15.0000 mg/kg | Freq: Once | INTRAVENOUS | Status: DC
  Filled 2015-10-30: qty 1634

## 2015-10-30 MED ORDER — AZTREONAM 1 G IJ SOLR
INTRAMUSCULAR | Status: AC
  Filled 2015-10-30: qty 1

## 2015-10-30 MED ORDER — CIPROFLOXACIN IN D5W 400 MG/200ML IV SOLN
400.0000 mg | Freq: Two times a day (BID) | INTRAVENOUS | Status: DC
  Filled 2015-10-30 (×2): qty 200

## 2015-10-30 MED ORDER — IOPAMIDOL (ISOVUE-300) INJECTION 61%
100.0000 mL | Freq: Once | INTRAVENOUS | Status: AC | PRN
  Administered 2015-10-30: 100 mL via INTRAVENOUS

## 2015-10-30 MED ORDER — HYDROCODONE-ACETAMINOPHEN 5-325 MG PO TABS: 1.0000 | ORAL_TABLET | ORAL | Status: DC | PRN

## 2015-10-30 MED ORDER — ONDANSETRON HCL 4 MG PO TABS: 4.0000 mg | ORAL_TABLET | Freq: Four times a day (QID) | ORAL | Status: DC | PRN

## 2015-10-30 MED ORDER — ONDANSETRON HCL 4 MG/2ML IJ SOLN: 4.0000 mg | Freq: Four times a day (QID) | INTRAMUSCULAR | Status: DC | PRN

## 2015-10-30 MED ORDER — ACETAMINOPHEN 325 MG PO TABS: 650.0000 mg | ORAL_TABLET | Freq: Four times a day (QID) | ORAL | Status: DC | PRN

## 2015-10-30 MED ORDER — VANCOMYCIN HCL 10 G IV SOLR
1500.0000 mg | Freq: Once | INTRAVENOUS | Status: AC
  Administered 2015-10-30: 1500 mg via INTRAVENOUS
  Filled 2015-10-30: qty 1500

## 2015-10-30 MED ORDER — METRONIDAZOLE IN NACL 5-0.79 MG/ML-% IV SOLN
500.0000 mg | Freq: Once | INTRAVENOUS | Status: AC
  Administered 2015-10-30: 500 mg via INTRAVENOUS
  Filled 2015-10-30: qty 100

## 2015-10-30 NOTE — H&P (Signed)
History and Physical    Gary MaoJohan Davies ZOX:[id number] DOB: 12/13/1960 DOA: 10/30/2015  PCP: Maryelizabeth RowanEWEY,ELIZABETH, MD   Patient [facility]: [facility]  Chief Complaint: Lower abd pain, fevers  HPI: Gary MaoJohan Davies  is a 55 y.o. male, without significant past medical history, presents with complaints of lower abdominal pain over last week, progressively worsening over last 24 hours, mainly in the right lower quadrant, patient reports decreased oral intake, loss of appetite, he reports feeling feverish at home, he denies any nausea, vomiting, coffee-ground emesis, hematuria, dysuria, diarrhea, blood per rectum, testicular pain, sick contacts.Patient reports colonoscopy 7 years ago d/t FHx of colorectal CA with no acute findings. [facility].   ED Course: Pt was afebrile, and with stable vitals. CT abdomen and pelvis was obtained suggestive of acute diverticulitis in the area above the urinary bladder with possible small contained perforation and satellite cystitis. He is afebrile, white blood cell count of 11.8, with negative urine analysis. He was treated with vancomycin, aztreonam, Flagyl, and Cipro in the ED, remained hemodynamically stable, and [facility] he will be admitted for ongoing evaluation and management of acute diverticulitis.  Review of Systems:  All other systems reviewed and apart from HPI, are negative.  Past Medical History:  Diagnosis Date  . Cellulitis   . Chicken pox   . Hypertension   . Shingles     Past Surgical History:  Procedure Laterality Date  . FOREIGN BODY REMOVAL RECTAL N/A 06/20/2014   Procedure: REMOVAL FOREIGN BODY RECTAL;  Surgeon: Ovidio Kinavid Newman, MD;  Location: [facility];  Service: General;  Laterality: N/A;  . UVULOPALATOPHARYNGOPLASTY       reports that he has been smoking.  He has never used smokeless tobacco. He reports that he does not drink alcohol or use drugs.  Allergies  Allergen Reactions  .  Amoxicillin Swelling and Other (See Comments)    Generalized swelling  . Keflex [Cephalexin] Other (See Comments)    Family History  Problem Relation Age of Onset  . Family history unknown: Yes     Prior to Admission medications   Medication Sig Start Date End Date Taking? Authorizing Provider  Ascorbic Acid (VITAMIN C) 1000 MG tablet Take 1,000 mg by mouth daily as needed (for immune).    Historical Provider, MD  magnesium oxide (MAG-OX) 400 MG tablet Take 400 mg by mouth daily.    Historical Provider, MD    Physical Exam: Vitals:   10/30/15 1400 10/30/15 1500 10/30/15 1600 10/30/15 1651  BP: 123/79 116/72 124/78 128/78  Pulse: 72 63 74 78  Resp:    16  Temp:      TempSrc:      SpO2: 96% 94% 96% 96%  Weight:      Height:          Constitutional: NAD, calm, comfortable Eyes: PERTLA, lids and conjunctivae normal ENMT: Mucous membranes are moist. Posterior pharynx clear of any exudate or lesions.   Neck: normal, supple, no masses, no thyromegaly Respiratory: clear to auscultation bilaterally, no wheezing, no crackles. Normal respiratory effort.   Cardiovascular: S1 & S2 heard, regular rate and rhythm. No carotid bruits. No significant JVD. Abdomen: No distension, tender in lower abd at midline without rebound pain or guarding, no masses palpated. Bowel sounds normal.  Musculoskeletal: no clubbing / cyanosis. No joint deformity upper and lower extremities.   Skin: no significant rashes, lesions, ulcers. Warm, dry, well-perfused. Neurologic: CN 2-12  grossly intact. Sensation intact, DTR normal. Strength 5/5 in all 4 limbs.  Psychiatric: Normal judgment and insight. Alert and oriented x 3. Normal mood and affect.     Labs on Admission: I have personally reviewed following labs and imaging studies  CBC:  Recent Labs Lab 10/29/15 2238 10/30/15 0959  WBC 13.5* 11.8*  NEUTROABS  --  8.4*  HGB 17.0 17.3*  HCT 48.1 48.4  MCV 93.2 92.2  PLT 224 196   Basic Metabolic  Panel:  Recent Labs Lab 10/29/15 2238 10/30/15 0959  NA 139 137  K 4.1 3.9  CL 106 104  CO2 25 26  GLUCOSE 101* 93  BUN 19 17  CREATININE 1.30* 1.15  CALCIUM 9.0 9.3   GFR: Estimated Creatinine Clearance: 90.4 mL/min (by C-G formula based on SCr of 1.15 mg/dL). Liver Function Tests:  Recent Labs Lab 10/29/15 2238 10/30/15 0959  AST 37 28  ALT 45 40  ALKPHOS 49 44  BILITOT 0.9 1.1  PROT 6.4* 7.2  ALBUMIN 3.9 4.2    Recent Labs Lab 10/29/15 2238 10/30/15 0959  LIPASE 28 18   No results for input(s): AMMONIA in the last 168 hours. Coagulation Profile: No results for input(s): INR, PROTIME in the last 168 hours. Cardiac Enzymes: No results for input(s): CKTOTAL, CKMB, CKMBINDEX, TROPONINI in the last 168 hours. BNP (last 3 results) No results for input(s): PROBNP in the last 8760 hours. HbA1C: No results for input(s): HGBA1C in the last 72 hours. CBG: No results for input(s): GLUCAP in the last 168 hours. Lipid Profile: No results for input(s): CHOL, HDL, LDLCALC, TRIG, CHOLHDL, LDLDIRECT in the last 72 hours. Thyroid Function Tests: No results for input(s): TSH, T4TOTAL, FREET4, T3FREE, THYROIDAB in the last 72 hours. Anemia Panel: No results for input(s): VITAMINB12, FOLATE, FERRITIN, TIBC, IRON, RETICCTPCT in the last 72 hours. Urine analysis:    Component Value Date/Time   COLORURINE YELLOW 10/30/2015 1026   APPEARANCEUR CLEAR 10/30/2015 1026   LABSPEC 1.008 10/30/2015 1026   PHURINE 6.0 10/30/2015 1026   GLUCOSEU NEGATIVE 10/30/2015 1026   HGBUR NEGATIVE 10/30/2015 1026   BILIRUBINUR NEGATIVE 10/30/2015 1026   KETONESUR NEGATIVE 10/30/2015 1026   PROTEINUR NEGATIVE 10/30/2015 1026   NITRITE NEGATIVE 10/30/2015 1026   LEUKOCYTESUR NEGATIVE 10/30/2015 1026   Sepsis Labs: @LABRCNTIP (procalcitonin:4,lacticidven:4) )No results found for this or any previous visit (from the past 240 hour(s)).   Radiological Exams on Admission: Ct Abdomen Pelvis W  Contrast  Result Date: 10/30/2015 CLINICAL DATA:  Right lower quadrant pain for 5 days EXAM: CT ABDOMEN AND PELVIS WITH CONTRAST TECHNIQUE: Multidetector CT imaging of the abdomen and pelvis was performed using the standard protocol following bolus administration of intravenous contrast. CONTRAST:  ISOVUE-300 IOPAMIDOL (ISOVUE-300) INJECTION 61% COMPARISON:  06/20/2014 FINDINGS: Lower chest: The lung bases are unremarkable. Hepatobiliary: No gallstones are noted within gallbladder. No focal hepatic mass. Pancreas: Unremarkable. No pancreatic ductal dilatation or surrounding inflammatory changes. Spleen: Normal in size without focal abnormality. Adrenals/Urinary Tract: No adrenal gland mass. Enhanced kidneys are symmetrical in size. No hydronephrosis or hydroureter. Stomach/Bowel: No small bowel obstruction. Moderate stool are noted the right colo transverse colon and descending colon. No pericecal inflammation is noted. Normal appendix is partially visualized in coronal image 43. Few diverticula are noted in sigmoid colon. Axial image 53 there is short segment thickening of colonic and diverticular wall in mid sigmoid colon just above the urinary bladder. Significant stranding of pericolonic fat. Findings are consistent with focal acute colitis  or diverticulitis. Axial image 55 there is tiny amount of extraluminal air suspicious for small contained perforation. There is also thickening of the upper wall of urinary bladder and stranding of the fat surrounding the upper urinary bladder. Findings are consistent with satellite cystitis. No pericolonic abscess. No definite perforation of bladder wall. Vascular/Lymphatic: No aortic aneurysm. Atherosclerotic calcifications of abdominal aorta and iliac arteries. No adenopathy. Reproductive: Prostate gland and seminal vesicles are unremarkable. Other: No free air in upper abdomen. The urinary bladder is unremarkable. Musculoskeletal: No destructive bony lesions are  noted. There are degenerative changes lumbar spine. Stable L1 compression deformity. IMPRESSION: 1. Axial image 53 there is short segment thickening of colonic and diverticular wall in mid sigmoid colon just above the urinary bladder. This segment of the colon measures at least 7 cm in length. Significant stranding of pericolonic fat. Findings are consistent with focal acute colitis or diverticulitis. Axial image 55 there is tiny amount of extraluminal air suspicious for small contained perforation. Although this may represent air within a collapsed colonic diverticulum. There is also thickening of the upper wall of urinary bladder and stranding of the fat surrounding the upper urinary bladder. Findings are consistent with satellite cystitis. 2. No hydronephrosis or hydroureter. 3. No pericecal inflammation.  Normal appendix. These results were called by telephone at the time of interpretation on 10/30/2015 at 11:27 am to Dr. Wynetta Emery , who verbally acknowledged these results. Electronically Signed   By: Natasha Mead M.D.   On: 10/30/2015 11:27    EKG: Not performed, will obtain as appropriate.   Assessment/Plan  1. Acute diverticulitis  - Pt is afebrile here with mild leukocytosis, no tachycardia or hypotension; there has been no N/V/D - CT findings suggest acute diverticulitis vs focal colitis with ?small contained perforation and satellite cystitis; pt is non-toxic in appearance with no peritoneal signs on admission - He was treated with vancomycin, aztreonam, Flagyl, and Cipro in the outside ED - Continue empiric treatment with Cipro and Flagyl - IV hydration with NS  - Symptomatic care with prn analgesics and antiemetics  - Advance diet as tolerated    2. Hypertension  - Has documented hx of such of not under treatment  - BP modestly elevated; pain may be contributing  - Monitor and treat prn with hydralazine IVP's for now    DVT prophylaxis: sq Lovenox  Code Status: Full  Family  Communication: Discussed with patient Disposition Plan: Admit to med-surg Consults called: None Admission status: Inpatient    Briscoe Deutscher, MD [facility] Pager [phone]  If 7PM-7AM, please contact night-coverage [email or web address] Password [email or web address]  10/30/2015, 7:25 PM

## 2015-10-30 NOTE — ED Notes (Signed)
Back from CT, pt ambulated to bathroom without difficulty.

## 2015-10-30 NOTE — ED Notes (Signed)
[facility] but is refusing transport via [facility]. [place], PA is agreeable to pt transferring POV with his wife. Pt will remain in the ED to complete abx before transfer.

## 2015-10-30 NOTE — Progress Notes (Signed)
Patient presents with complaints of abdominal pain, subjective fever, workup significant for acute diverticulitis/colitis, accepted to MedSurg bed. Huey Bienenstockawood Geoffrey Mankin MD

## 2015-10-30 NOTE — ED Notes (Signed)
Patient transported to CT 

## 2015-10-30 NOTE — ED Provider Notes (Signed)
[facility] Provider Note   CSN: [id number] Arrival date & time: 10/30/15  0847     History   Chief Complaint Chief Complaint  Patient presents with  . Abdominal Pain    HPI   Blood pressure 155/99, pulse 83, temperature 98.4 F (36.9 C), temperature source Oral, resp. rate 18, height 5\' 9"  (1.753 m), weight 108.9 kg, SpO2 98 %.  Gary Davies is a 55 y.o. male complaining of Lower abdominal pain worse on the right than the left, initially intermittent onset 1 week ago, significantly worsening yesterday, localizing to the right lower quadrant with decreased by mouth intake, tactile fever, not alleviated with antispasmodic medication. Denies history ofof abdominal surgeries. Patient denies emesis, dysuria, hematuria, diarrhea, testicular pain or swelling, sick contacts.Denies prodrome of viral illness. States pain is colicky, goes from 0-8. Described as sharp in nature.  Past Medical History:  Diagnosis Date  . Cellulitis   . Chicken pox   . Hypertension   . Shingles     Patient Active Problem List   Diagnosis Date Noted  . AKI (acute kidney injury) (HCC) 03/24/2015    Past Surgical History:  Procedure Laterality Date  . FOREIGN BODY REMOVAL RECTAL N/A 06/20/2014   Procedure: REMOVAL FOREIGN BODY RECTAL;  Surgeon: Ovidio Kinavid Newman, MD;  Location: [facility]S;  Service: General;  Laterality: N/A;  . UVULOPALATOPHARYNGOPLASTY         Home Medications    Prior to Admission medications   Medication Sig Start Date End Date Taking? Authorizing Provider  Ascorbic Acid (VITAMIN C) 1000 MG tablet Take 1,000 mg by mouth daily as needed (for immune).    Historical Provider, MD  magnesium oxide (MAG-OX) 400 MG tablet Take 400 mg by mouth daily.    Historical Provider, MD    Family History Family History  Problem Relation Age of Onset  . Family history unknown: Yes    Social History Social History  Substance Use Topics  . Smoking status: Current Every Day Smoker   . Smokeless tobacco: Never Used  . Alcohol use No     Allergies   Amoxicillin and Keflex [cephalexin]   Review of Systems Review of Systems  10 systems reviewed and found to be negative, except as noted in the HPI.   Physical Exam Updated Vital Signs BP 155/99 (BP Location: Left Arm)   Pulse 83   Temp 98.4 F (36.9 C) (Oral)   Resp 18   Ht 5\' 9"  (1.753 m)   Wt 108.9 kg   SpO2 98%   BMI 35.44 kg/m   Physical Exam  Constitutional: He is oriented to person, place, and time. He appears well-developed and well-nourished. No distress.  HENT:  Head: Normocephalic and atraumatic.  Mouth/Throat: Oropharynx is clear and moist.  Eyes: Conjunctivae and EOM are normal. Pupils are equal, round, and reactive to light.  Neck: Normal range of motion.  Cardiovascular: Normal rate, regular rhythm and intact distal pulses.   Pulmonary/Chest: Effort normal and breath sounds normal.  Abdominal: Soft. He exhibits no distension and no mass. There is tenderness. There is no rebound and no guarding. No hernia.  Tender palpation in the right lower quadrant greater than the left lower quadrant. No guarding or rebound period so as, obturator and Rovsing negative.  Musculoskeletal: Normal range of motion.  Neurological: He is alert and oriented to person, place, and time.  Skin: He is not diaphoretic.  Psychiatric: He has a normal mood and affect.  Nursing note and vitals  reviewed.    ED Treatments / Results  Labs (all labs ordered are listed, but only abnormal results are displayed) Labs Reviewed  CBC WITH DIFFERENTIAL/PLATELET - Abnormal; Notable for the following:       Result Value   WBC 11.8 (*)    Hemoglobin 17.3 (*)    Neutro Abs 8.4 (*)    Monocytes Absolute 1.3 (*)    All other components within normal limits  COMPREHENSIVE METABOLIC PANEL  LIPASE, BLOOD  URINALYSIS, ROUTINE W REFLEX MICROSCOPIC (NOT AT [facility])    EKG  EKG Interpretation None       Radiology Ct  Abdomen Pelvis W Contrast  Result Date: 10/30/2015 CLINICAL DATA:  Right lower quadrant pain for 5 days EXAM: CT ABDOMEN AND PELVIS WITH CONTRAST TECHNIQUE: Multidetector CT imaging of the abdomen and pelvis was performed using the standard protocol following bolus administration of intravenous contrast. CONTRAST:  ISOVUE-300 IOPAMIDOL (ISOVUE-300) INJECTION 61% COMPARISON:  06/20/2014 FINDINGS: Lower chest: The lung bases are unremarkable. Hepatobiliary: No gallstones are noted within gallbladder. No focal hepatic mass. Pancreas: Unremarkable. No pancreatic ductal dilatation or surrounding inflammatory changes. Spleen: Normal in size without focal abnormality. Adrenals/Urinary Tract: No adrenal gland mass. Enhanced kidneys are symmetrical in size. No hydronephrosis or hydroureter. Stomach/Bowel: No small bowel obstruction. Moderate stool are noted the right colo transverse colon and descending colon. No pericecal inflammation is noted. Normal appendix is partially visualized in coronal image 43. Few diverticula are noted in sigmoid colon. Axial image 53 there is short segment thickening of colonic and diverticular wall in mid sigmoid colon just above the urinary bladder. Significant stranding of pericolonic fat. Findings are consistent with focal acute colitis or diverticulitis. Axial image 55 there is tiny amount of extraluminal air suspicious for small contained perforation. There is also thickening of the upper wall of urinary bladder and stranding of the fat surrounding the upper urinary bladder. Findings are consistent with satellite cystitis. No pericolonic abscess. No definite perforation of bladder wall. Vascular/Lymphatic: No aortic aneurysm. Atherosclerotic calcifications of abdominal aorta and iliac arteries. No adenopathy. Reproductive: Prostate gland and seminal vesicles are unremarkable. Other: No free air in upper abdomen. The urinary bladder is unremarkable. Musculoskeletal: No destructive  bony lesions are noted. There are degenerative changes lumbar spine. Stable L1 compression deformity. IMPRESSION: 1. Axial image 53 there is short segment thickening of colonic and diverticular wall in mid sigmoid colon just above the urinary bladder. This segment of the colon measures at least 7 cm in length. Significant stranding of pericolonic fat. Findings are consistent with focal acute colitis or diverticulitis. Axial image 55 there is tiny amount of extraluminal air suspicious for small contained perforation. Although this may represent air within a collapsed colonic diverticulum. There is also thickening of the upper wall of urinary bladder and stranding of the fat surrounding the upper urinary bladder. Findings are consistent with satellite cystitis. 2. No hydronephrosis or hydroureter. 3. No pericecal inflammation.  Normal appendix. These results were called by telephone at the time of interpretation on 10/30/2015 at 11:27 am to Dr. Wynetta Emery , who verbally acknowledged these results. Electronically Signed   By: Natasha Mead M.D.   On: 10/30/2015 11:27    Procedures Procedures (including critical care time)  Medications Ordered in ED Medications  metroNIDAZOLE (FLAGYL) IVPB 500 mg (not administered)  ciprofloxacin (CIPRO) IVPB 400 mg (not administered)  vancomycin (VANCOCIN) 1,500 mg in sodium chloride 0.9 % 500 mL IVPB (1,500 mg Intravenous New Bag/Given 10/30/15 1156)  vancomycin (VANCOCIN) 500 MG powder (  Not Given 10/30/15 1200)  aztreonam (AZACTAM) 1 g in dextrose 5 % 50 mL IVPB (not administered)  sodium chloride 0.9 % bolus 1,000 mL (0 mLs Intravenous Stopped 10/30/15 1200)  ondansetron (ZOFRAN) injection 4 mg (4 mg Intravenous Given 10/30/15 1007)  iopamidol (ISOVUE-300) 61 % injection 100 mL (100 mLs Intravenous Contrast Given 10/30/15 1050)     Initial Impression / Assessment and Plan / ED Course  I have reviewed the triage vital signs and the nursing notes.  Pertinent  labs & imaging results that were available during my care of the patient were reviewed by me and considered in my medical decision making (see chart for details).  Clinical Course    Vitals:   10/30/15 0857  BP: 155/99  Pulse: 83  Resp: 18  Temp: 98.4 F (36.9 C)  TempSrc: Oral  SpO2: 98%  Weight: 108.9 kg  Height: 5\' 9"  (1.753 m)    Medications  metroNIDAZOLE (FLAGYL) IVPB 500 mg (not administered)  ciprofloxacin (CIPRO) IVPB 400 mg (not administered)  vancomycin (VANCOCIN) 1,500 mg in sodium chloride 0.9 % 500 mL IVPB (1,500 mg Intravenous New Bag/Given 10/30/15 1156)  vancomycin (VANCOCIN) 500 MG powder (  Not Given 10/30/15 1200)  aztreonam (AZACTAM) 1 g in dextrose 5 % 50 mL IVPB (not administered)  sodium chloride 0.9 % bolus 1,000 mL (0 mLs Intravenous Stopped 10/30/15 1200)  ondansetron (ZOFRAN) injection 4 mg (4 mg Intravenous Given 10/30/15 1007)  iopamidol (ISOVUE-300) 61 % injection 100 mL (100 mLs Intravenous Contrast Given 10/30/15 1050)    Gary Davies is 55 y.o. male presenting with lower abdominal pain worse on the right than the left, initially onset 1 week ago but significantly worsening in severity yesterday. Associated tactile fever with decreased by mouth intake. Abdominal exam is nonsurgical.  Verbal report from radiology appreciated: States patient has extensive disease of colitis versus diverticulitis, extensive segment of the sigmoid colon is affected, 7 cm. Is just above the bladder and there appears to be bladder wall thickening compatible with a satellite cystitis. He also noted tiny air just adjacent to the inflammation, possibly inside a diverticulum versus tiny contained perforation.  Patient had colonoscopy 7 years ago, father passed away from colon cancer at age 33, there is no abnormality on the original colonoscopy. He has allergy to amoxicillin, states that he swells all over without any shortness of breath, nausea, vomiting. He has allergy to  Keflex, he was taking it for cellulitis and developed a diffuse rash all over his body again with no shortness of breath, nausea vomiting lip or tongue swelling.  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 Patient prefers to go by POV. He understands the risks of traveling by private vehicle. Will go directly to [facility]. IV will be kept in place.  It will take several hours for the first dose of antibiotics to be administered. I think it is important to get antibiotics on board. I begin offered him transport via [facility] to facilitate however, he declines and states that he wants to hold antibiotics are gone and wife will transfer by private vehicle.   Final Clinical Impressions(s) / ED Diagnoses   Final diagnoses:  Colitis    New Prescriptions New Prescriptions   No medications on file     [facility],  PA-C 10/30/15 [address], PA-C 10/30/15 1307    Maia Plan, MD 10/30/15 1451

## 2015-10-30 NOTE — ED Notes (Signed)
Pt said he was not waiting any longer

## 2015-10-30 NOTE — Discharge Instructions (Signed)
[facility], going through the main entrance and ask for admissions, you have bed assigned by attending physician Dr. Arthor CaptainElmahi

## 2015-10-30 NOTE — Progress Notes (Signed)
Patient arrived on unit.  Company secretarylow manager paged.

## 2015-10-30 NOTE — ED Notes (Signed)
Lab work was not ordered with triage due to previous collection last night. Will allow providers to decide what is needed. Urine will be collected.

## 2015-10-30 NOTE — ED Notes (Signed)
PA at bedside.

## 2015-10-30 NOTE — ED Triage Notes (Signed)
Pt reports having RLQ pain for 5 days, last night the pain got worse and [facility] but left before being seen. Pt states today pain is intermittent and comes in waves, radiates to the groin and has pain with urination. Denies N/V/D he is unsure of fever.

## 2015-10-31 DIAGNOSIS — K5792 Diverticulitis of intestine, part unspecified, without perforation or abscess without bleeding: Secondary | ICD-10-CM | POA: Diagnosis not present

## 2015-10-31 LAB — BASIC METABOLIC PANEL
ANION GAP: 7 (ref 5–15)
BUN: 14 mg/dL (ref 6–20)
CALCIUM: 8.9 mg/dL (ref 8.9–10.3)
CO2: 28 mmol/L (ref 22–32)
Chloride: 105 mmol/L (ref 101–111)
Creatinine, Ser: 1.26 mg/dL — ABNORMAL HIGH (ref 0.61–1.24)
Glucose, Bld: 93 mg/dL (ref 65–99)
Potassium: 4.1 mmol/L (ref 3.5–5.1)
Sodium: 140 mmol/L (ref 135–145)

## 2015-10-31 LAB — CBC WITH DIFFERENTIAL/PLATELET
BASOS ABS: 0 10*3/uL (ref 0.0–0.1)
BASOS PCT: 1 %
Eosinophils Absolute: 0.3 10*3/uL (ref 0.0–0.7)
Eosinophils Relative: 4 %
HEMATOCRIT: 46.5 % (ref 39.0–52.0)
Hemoglobin: 15.9 g/dL (ref 13.0–17.0)
Lymphocytes Relative: 26 %
Lymphs Abs: 2 10*3/uL (ref 0.7–4.0)
MCH: 32.7 pg (ref 26.0–34.0)
MCHC: 34.2 g/dL (ref 30.0–36.0)
MCV: 95.7 fL (ref 78.0–100.0)
MONO ABS: 0.8 10*3/uL (ref 0.1–1.0)
Monocytes Relative: 10 %
NEUTROS ABS: 4.4 10*3/uL (ref 1.7–7.7)
Neutrophils Relative %: 59 %
PLATELETS: 197 10*3/uL (ref 150–400)
RBC: 4.86 MIL/uL (ref 4.22–5.81)
RDW: 14 % (ref 11.5–15.5)
WBC: 7.5 10*3/uL (ref 4.0–10.5)

## 2015-10-31 MED ORDER — SULFAMETHOXAZOLE-TRIMETHOPRIM 800-160 MG PO TABS: 1.0000 | ORAL_TABLET | Freq: Two times a day (BID) | ORAL | 0 refills | Status: AC

## 2015-10-31 MED ORDER — SODIUM CHLORIDE 0.9 % IV SOLN
2.0000 g | Freq: Three times a day (TID) | INTRAVENOUS | Status: DC
  Administered 2015-10-31: 2 g via INTRAVENOUS
  Filled 2015-10-31 (×2): qty 2

## 2015-10-31 MED ORDER — ONDANSETRON HCL 4 MG PO TABS
4.0000 mg | ORAL_TABLET | Freq: Four times a day (QID) | ORAL | 0 refills | Status: AC | PRN
Start: 2015-10-31 — End: ?

## 2015-10-31 MED ORDER — HYDROCODONE-ACETAMINOPHEN 5-325 MG PO TABS: 1.0000 | ORAL_TABLET | ORAL | 0 refills | Status: AC | PRN

## 2015-10-31 MED ORDER — METRONIDAZOLE 500 MG PO TABS: 500.0000 mg | ORAL_TABLET | Freq: Three times a day (TID) | ORAL | 0 refills | Status: AC

## 2015-10-31 NOTE — Progress Notes (Signed)
Discharge instructions given to pt, verbalized understanding. Left the unit in stable condition. 

## 2015-10-31 NOTE — Discharge Summary (Signed)
Physician Discharge Summary  Gary Davies ZOX:[id number] DOB: [birth year] DOA: 10/30/2015  PCP: Maryelizabeth Rowan, MD  Admit date: 10/30/2015 Discharge date: 10/31/2015  Admitted From:Home Disposition:  HOme.   Recommendations for Outpatient Follow-up:  1. Follow up with PCP in 1-2 weeks 2. Please obtain BMP/CBC in one week     Discharge Condition:guarded.  CODE STATUS:full code.  Diet recommendation: Heart Healthy  Brief/Interim Summary: JohanGrundlinghis a 55 y.o.male,without significant past medical history, presents with complaints of lower abdominal pain over last week, progressively worsening over last 24 hours, mainly in the right lower quadrant.  CT abdomen and pelvis was obtained suggestive of acute diverticulitis in the area above the urinary bladder with possible small contained perforation and satellite cystitis. He was admitted for IV antibiotics.   Discharge Diagnoses:  Principal Problem:   Acute diverticulitis Active Problems:   Essential hypertension  1. Acute diverticulitis  - Pt is afebrile here with mild leukocytosis, no tachycardia or hypotension; there has been no N/V/D - CT findings suggest acute diverticulitis vs focal colitis with ?small contained perforation and satellite cystitis; pt is non-toxic in appearance with no peritoneal signs on admission - He was treated with vancomycin, aztreonam, Flagyl, and Cipro in the outside ED He was started on IV antibiotics, advanced diet , pt wanted to go hom,e changed to po bactrim and flagyl for a course of 14 days.  Recommend outpatient follow up with PCP.   2. Hypertension  Well controlled.   Discharge Instructions  Discharge Instructions    Diet - low sodium heart healthy    Complete by:  As directed    Discharge instructions    Complete by:  As directed    Please follow up with PCP on Monday.  Please come to ER if your symptoms worsen.       Medication List    STOP taking these medications    magnesium oxide 400 MG tablet Commonly known as:  MAG-OX     TAKE these medications   HYDROcodone-acetaminophen 5-325 MG tablet Commonly known as:  NORCO/VICODIN Take 1 tablet by mouth every 4 (four) hours as needed for moderate pain.   metroNIDAZOLE 500 MG tablet Commonly known as:  FLAGYL Take 1 tablet (500 mg total) by mouth 3 (three) times daily.   ondansetron 4 MG tablet Commonly known as:  ZOFRAN Take 1 tablet (4 mg total) by mouth every 6 (six) hours as needed for nausea.   sulfamethoxazole-trimethoprim 800-160 MG tablet Commonly known as:  BACTRIM DS,SEPTRA DS Take 1 tablet by mouth 2 (two) times daily.   vitamin C 1000 MG tablet Take 1,000 mg by mouth daily as needed (for immune).      Follow-up Information    DEWEY,ELIZABETH, MD Follow up on 11/03/2015.   Specialty:  Family Medicine Contact information: [address] [place] [address] [phone]          Allergies  Allergen Reactions  . Amoxicillin Swelling and Other (See Comments)    Generalized swelling  . Keflex [Cephalexin] Other (See Comments)    Consultations:  NONE   Procedures/Studies: Ct Abdomen Pelvis W Contrast  Result Date: 10/30/2015 CLINICAL DATA:  Right lower quadrant pain for 5 days EXAM: CT ABDOMEN AND PELVIS WITH CONTRAST TECHNIQUE: Multidetector CT imaging of the abdomen and pelvis was performed using the standard protocol following bolus administration of intravenous contrast. CONTRAST:  ISOVUE-300 IOPAMIDOL (ISOVUE-300) INJECTION 61% COMPARISON:  06/20/2014 FINDINGS: Lower chest: The lung bases are unremarkable. Hepatobiliary: No gallstones  are noted within gallbladder. No focal hepatic mass. Pancreas: Unremarkable. No pancreatic ductal dilatation or surrounding inflammatory changes. Spleen: Normal in size without focal abnormality. Adrenals/Urinary Tract: No adrenal gland mass. Enhanced kidneys are symmetrical in size. No hydronephrosis or hydroureter.  Stomach/Bowel: No small bowel obstruction. Moderate stool are noted the right colo transverse colon and descending colon. No pericecal inflammation is noted. Normal appendix is partially visualized in coronal image 43. Few diverticula are noted in sigmoid colon. Axial image 53 there is short segment thickening of colonic and diverticular wall in mid sigmoid colon just above the urinary bladder. Significant stranding of pericolonic fat. Findings are consistent with focal acute colitis or diverticulitis. Axial image 55 there is tiny amount of extraluminal air suspicious for small contained perforation. There is also thickening of the upper wall of urinary bladder and stranding of the fat surrounding the upper urinary bladder. Findings are consistent with satellite cystitis. No pericolonic abscess. No definite perforation of bladder wall. Vascular/Lymphatic: No aortic aneurysm. Atherosclerotic calcifications of abdominal aorta and iliac arteries. No adenopathy. Reproductive: Prostate gland and seminal vesicles are unremarkable. Other: No free air in upper abdomen. The urinary bladder is unremarkable. Musculoskeletal: No destructive bony lesions are noted. There are degenerative changes lumbar spine. Stable L1 compression deformity. IMPRESSION: 1. Axial image 53 there is short segment thickening of colonic and diverticular wall in mid sigmoid colon just above the urinary bladder. This segment of the colon measures at least 7 cm in length. Significant stranding of pericolonic fat. Findings are consistent with focal acute colitis or diverticulitis. Axial image 55 there is tiny amount of extraluminal air suspicious for small contained perforation. Although this may represent air within a collapsed colonic diverticulum. There is also thickening of the upper wall of urinary bladder and stranding of the fat surrounding the upper urinary bladder. Findings are consistent with satellite cystitis. 2. No hydronephrosis or  hydroureter. 3. No pericecal inflammation.  Normal appendix. These results were called by telephone at the time of interpretation on 10/30/2015 at 11:27 am to Dr. Wynetta EmeryNICOLE PISCIOTTA , who verbally acknowledged these results. Electronically Signed   By: Natasha MeadLiviu  Pop M.D.   On: 10/30/2015 11:27       Subjective: No nausea, vomiting and abdominal pain.   Discharge Exam: Vitals:   10/31/15 1030 10/31/15 1331  BP: (!) 160/92 (!) 143/84  Pulse: 61 62  Resp: 16 16  Temp:  98.5 F (36.9 C)   Vitals:   10/31/15 0543 10/31/15 0830 10/31/15 1030 10/31/15 1331  BP: 139/79 (!) 152/96 (!) 160/92 (!) 143/84  Pulse: 75 68 61 62  Resp: 18 16 16 16   Temp: 98.2 F (36.8 C) 98.7 F (37.1 C)  98.5 F (36.9 C)  TempSrc: Oral Oral  Oral  SpO2: 98% 97%  95%  Weight:      Height:        General: Pt is alert, awake, not in acute distress Cardiovascular: RRR, S1/S2 +, no rubs, no gallops Respiratory: CTA bilaterally, no wheezing, no rhonchi Abdominal: Soft, NT, ND, bowel sounds + Extremities: no edema, no cyanosis    The results of significant diagnostics from this hospitalization (including imaging, microbiology, ancillary and laboratory) are listed below for reference.     Microbiology: No results found for this or any previous visit (from the past 240 hour(s)).   Labs: BNP (last 3 results) No results for input(s): BNP in the last 8760 hours. Basic Metabolic Panel:  Recent Labs Lab 10/29/15 2238 10/30/15 0959 10/31/15  0557  NA 139 137 140  K 4.1 3.9 4.1  CL 106 104 105  CO2 25 26 28   GLUCOSE 101* 93 93  BUN 19 17 14   CREATININE 1.30* 1.15 1.26*  CALCIUM 9.0 9.3 8.9   Liver Function Tests:  Recent Labs Lab 10/29/15 2238 10/30/15 0959  AST 37 28  ALT 45 40  ALKPHOS 49 44  BILITOT 0.9 1.1  PROT 6.4* 7.2  ALBUMIN 3.9 4.2    Recent Labs Lab 10/29/15 2238 10/30/15 0959  LIPASE 28 18   No results for input(s): AMMONIA in the last 168 hours. CBC:  Recent Labs Lab  10/29/15 2238 10/30/15 0959 10/31/15 0557  WBC 13.5* 11.8* 7.5  NEUTROABS  --  8.4* 4.4  HGB 17.0 17.3* 15.9  HCT 48.1 48.4 46.5  MCV 93.2 92.2 95.7  PLT 224 196 197   Cardiac Enzymes: No results for input(s): CKTOTAL, CKMB, CKMBINDEX, TROPONINI in the last 168 hours. BNP: Invalid input(s): POCBNP CBG: No results for input(s): GLUCAP in the last 168 hours. D-Dimer No results for input(s): DDIMER in the last 72 hours. Hgb A1c No results for input(s): HGBA1C in the last 72 hours. Lipid Profile No results for input(s): CHOL, HDL, LDLCALC, TRIG, CHOLHDL, LDLDIRECT in the last 72 hours. Thyroid function studies No results for input(s): TSH, T4TOTAL, T3FREE, THYROIDAB in the last 72 hours.  Invalid input(s): FREET3 Anemia work up No results for input(s): VITAMINB12, FOLATE, FERRITIN, TIBC, IRON, RETICCTPCT in the last 72 hours. Urinalysis    Component Value Date/Time   COLORURINE YELLOW 10/30/2015 1026   APPEARANCEUR CLEAR 10/30/2015 1026   LABSPEC 1.008 10/30/2015 1026   PHURINE 6.0 10/30/2015 1026   GLUCOSEU NEGATIVE 10/30/2015 1026   HGBUR NEGATIVE 10/30/2015 1026   BILIRUBINUR NEGATIVE 10/30/2015 1026   KETONESUR NEGATIVE 10/30/2015 1026   PROTEINUR NEGATIVE 10/30/2015 1026   NITRITE NEGATIVE 10/30/2015 1026   LEUKOCYTESUR NEGATIVE 10/30/2015 1026   Sepsis Labs Invalid input(s): PROCALCITONIN,  WBC,  LACTICIDVEN Microbiology No results found for this or any previous visit (from the past 240 hour(s)).   Time coordinating discharge: Over 30 minutes  SIGNED:   Kathlen Mody, MD  [facility] 10/31/2015, 5:16 PM Pager   If 7PM-7AM, please contact night-coverage [email or web address] Password [email or web address]

## 2015-10-31 NOTE — Progress Notes (Signed)
Pharmacy Antibiotic Note  Gary Davies is a 55 y.o. male admitted on 10/30/2015 with acute diverticulitis.  Pharmacy has been consulted for meropenem dosing since he has swelling with PCN and cephalosporin use; he also refuses cipro due to concerns for adverse effects.  Plan: - Meropenem 2g IV q8h - Follow up renal function & cultures  Height: 5\' 9"  (175.3 cm) Weight: 240 lb (108.9 kg) IBW/kg (Calculated) : 70.7  Temp (24hrs), Avg:98.3 F (36.8 C), Min:97.9 F (36.6 C), Max:98.7 F (37.1 C)   Recent Labs Lab 10/29/15 2238 10/30/15 0959 10/31/15 0557  WBC 13.5* 11.8* 7.5  CREATININE 1.30* 1.15 1.26*    Estimated Creatinine Clearance: 82.5 mL/min (by C-G formula based on SCr of 1.26 mg/dL (H)).    Allergies  Allergen Reactions  . Amoxicillin Swelling and Other (See Comments)    Generalized swelling  . Keflex [Cephalexin] Other (See Comments)    Antimicrobials this admission:  10/26 Cipro >> pt refused 10/26 Flagyl >> 10/27 10/27 Meropenem >>  Dose adjustments this admission:  ---  Microbiology results: none  Thank you for allowing pharmacy to be a part of this patient's care.  Loralee PacasErin Saadiya Wilfong, PharmD, BCPS Pager: [phone] 10/31/2015 9:31 AM
